# Patient Record
Sex: Female | Born: 2000 | Race: White | Hispanic: No | Marital: Single | Smoking: Former smoker
Health system: Southern US, Community
[De-identification: ages and names within clinical notes are randomized; demographics above are authoritative.]

## PROBLEM LIST (undated history)

## (undated) ENCOUNTER — Inpatient Hospital Stay (HOSPITAL_COMMUNITY): Payer: Self-pay

## (undated) DIAGNOSIS — H5210 Myopia, unspecified eye: Secondary | ICD-10-CM

## (undated) DIAGNOSIS — F419 Anxiety disorder, unspecified: Secondary | ICD-10-CM

## (undated) DIAGNOSIS — Z87442 Personal history of urinary calculi: Secondary | ICD-10-CM

## (undated) HISTORY — PX: TYMPANOSTOMY TUBE PLACEMENT: SHX32

## (undated) HISTORY — DX: Myopia, unspecified eye: H52.10

## (undated) HISTORY — DX: Anxiety disorder, unspecified: F41.9

## (undated) HISTORY — DX: Personal history of urinary calculi: Z87.442

## (undated) HISTORY — PX: LITHOTRIPSY: SUR834

---
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
## 2019-01-21 ENCOUNTER — Telehealth: Payer: Self-pay | Admitting: Primary Care

## 2019-01-21 DIAGNOSIS — G8929 Other chronic pain: Secondary | ICD-10-CM

## 2019-01-21 NOTE — Telephone Encounter (Signed)
Tried to call patient but voicemail is full and could not leave message.

## 2019-01-21 NOTE — Telephone Encounter (Signed)
Patient called today requesting a referral to a spine specialist.  She stated that it has been discussed at previous appointments, She stated that she is having a lot of trouble with her back/right arm. Patient stated it feels like she cant hold things for a long time in her right arm because it causes pain in her arm and back. Patient really wanted to see a specialist for this  [facility] in [place]

## 2019-01-21 NOTE — Telephone Encounter (Signed)
Please call patient:  Which part of her back is causing her pain? Upper, mid, lower, neck? Any numbness/tingling to the right arm?  I'll place the referral once I have this information. Thanks.

## 2019-01-22 NOTE — Telephone Encounter (Signed)
Spoken and notified patient of Carol Spears comments.   Pain can be found in the neck, shoulder blade, upper back.  There is more tingling than numbness off and on.

## 2019-01-22 NOTE — Telephone Encounter (Signed)
Patient returned Chan's call.  Patient can be reached at [phone].

## 2019-01-22 NOTE — Telephone Encounter (Signed)
Note, referral placed.

## 2019-04-05 NOTE — L&D Delivery Note (Signed)
Delivery Note Pt reached complete dilation +3 station and had urge.  At 12:41 PM a healthy female was delivered via Vaginal, Spontaneous (Presentation:   Occiput Anterior).  APGAR: 8, 9; weight 5#4oz .   Placenta status: Spontaneous;Pathology, Intact.  Cord: 3 vessels with the following complications: None.    Anesthesia: Epidural Episiotomy: None Lacerations: None Suture Repair: n/a Est. Blood Loss (mL):  Mom to postpartum.  Baby to Couplet care / Skin to Skin. Pt desires circumcision of baby Carol Spears 11/02/2019, 12:54 PM

## 2019-04-15 ENCOUNTER — Telehealth: Payer: Self-pay | Admitting: Primary Care

## 2019-04-15 NOTE — Telephone Encounter (Signed)
Noted  

## 2019-04-15 NOTE — Telephone Encounter (Signed)
Received info back from [facility] that patient never called them back after several attempts to schedule her for a consult. Referral will be closed.

## 2019-05-07 LAB — OB RESULTS CONSOLE GC/CHLAMYDIA
Chlamydia: NEGATIVE
Gonorrhea: NEGATIVE

## 2019-05-07 LAB — OB RESULTS CONSOLE RPR: RPR: NONREACTIVE

## 2019-05-07 LAB — OB RESULTS CONSOLE RUBELLA ANTIBODY, IGM: Rubella: IMMUNE

## 2019-05-07 LAB — OB RESULTS CONSOLE HIV ANTIBODY (ROUTINE TESTING): HIV: NONREACTIVE

## 2019-05-07 LAB — HEPATITIS C ANTIBODY: HCV Ab: NEGATIVE

## 2019-05-07 LAB — OB RESULTS CONSOLE HEPATITIS B SURFACE ANTIGEN: Hepatitis B Surface Ag: NEGATIVE

## 2019-05-09 ENCOUNTER — Inpatient Hospital Stay (HOSPITAL_COMMUNITY)
Admission: AD | Admit: 2019-05-09 | Discharge: 2019-05-09 | Disposition: A | Discharge status: Home / Self Care | Payer: Managed Care, Other (non HMO) | Attending: Obstetrics and Gynecology | Admitting: Obstetrics and Gynecology

## 2019-05-09 ENCOUNTER — Encounter (HOSPITAL_COMMUNITY): Payer: Self-pay | Admitting: Obstetrics and Gynecology

## 2019-05-09 ENCOUNTER — Other Ambulatory Visit: Payer: Self-pay

## 2019-05-09 DIAGNOSIS — Z881 Allergy status to other antibiotic agents status: Secondary | ICD-10-CM | POA: Insufficient documentation

## 2019-05-09 DIAGNOSIS — O210 Mild hyperemesis gravidarum: Secondary | ICD-10-CM | POA: Diagnosis present

## 2019-05-09 DIAGNOSIS — Z3A11 11 weeks gestation of pregnancy: Secondary | ICD-10-CM | POA: Insufficient documentation

## 2019-05-09 DIAGNOSIS — Z880 Allergy status to penicillin: Secondary | ICD-10-CM | POA: Diagnosis not present

## 2019-05-09 DIAGNOSIS — O99331 Smoking (tobacco) complicating pregnancy, first trimester: Secondary | ICD-10-CM | POA: Insufficient documentation

## 2019-05-09 DIAGNOSIS — O219 Vomiting of pregnancy, unspecified: Secondary | ICD-10-CM | POA: Diagnosis not present

## 2019-05-09 LAB — COMPREHENSIVE METABOLIC PANEL
ALT: 17 U/L (ref 0–44)
AST: 22 U/L (ref 15–41)
Albumin: 5.1 g/dL — ABNORMAL HIGH (ref 3.5–5.0)
Alkaline Phosphatase: 66 U/L (ref 38–126)
Anion gap: 15 (ref 5–15)
BUN: 8 mg/dL (ref 6–20)
CO2: 20 mmol/L — ABNORMAL LOW (ref 22–32)
Calcium: 10.2 mg/dL (ref 8.9–10.3)
Chloride: 101 mmol/L (ref 98–111)
Creatinine, Ser: 0.55 mg/dL (ref 0.44–1.00)
GFR calc Af Amer: >60 mL/min (ref >60)
GFR calc non Af Amer: >60 mL/min (ref >60)
Glucose, Bld: 70 mg/dL (ref 70–99)
Potassium: 3.6 mmol/L (ref 3.5–5.1)
Sodium: 136 mmol/L (ref 135–145)
Total Bilirubin: 1.1 mg/dL (ref 0.3–1.2)
Total Protein: 8.3 g/dL — ABNORMAL HIGH (ref 6.5–8.1)

## 2019-05-09 LAB — URINALYSIS, ROUTINE W REFLEX MICROSCOPIC
Bilirubin Urine: NEGATIVE
Glucose, UA: NEGATIVE mg/dL
Hgb urine dipstick: NEGATIVE
Ketones, ur: 80 mg/dL — AB
Leukocytes,Ua: NEGATIVE
Nitrite: NEGATIVE
Protein, ur: 30 mg/dL — AB
Specific Gravity, Urine: 1.029 (ref 1.005–1.030)
pH: 5 (ref 5.0–8.0)

## 2019-05-09 LAB — CBC WITH DIFFERENTIAL/PLATELET
Abs Immature Granulocytes: 0.03 10*3/uL (ref 0.00–0.07)
Basophils Absolute: 0 10*3/uL (ref 0.0–0.1)
Basophils Relative: 0 %
Eosinophils Absolute: 0 10*3/uL (ref 0.0–0.5)
Eosinophils Relative: 0 %
HCT: 41 % (ref 36.0–46.0)
Hemoglobin: 13.8 g/dL (ref 12.0–15.0)
Immature Granulocytes: 0 %
Lymphocytes Relative: 20 %
Lymphs Abs: 1.7 10*3/uL (ref 0.7–4.0)
MCH: 30.5 pg (ref 26.0–34.0)
MCHC: 33.7 g/dL (ref 30.0–36.0)
MCV: 90.5 fL (ref 80.0–100.0)
Monocytes Absolute: 0.3 10*3/uL (ref 0.1–1.0)
Monocytes Relative: 4 %
Neutro Abs: 6.1 10*3/uL (ref 1.7–7.7)
Neutrophils Relative %: 76 %
Platelets: 187 10*3/uL (ref 150–400)
RBC: 4.53 MIL/uL (ref 3.87–5.11)
RDW: 12.1 % (ref 11.5–15.5)
WBC: 8.2 10*3/uL (ref 4.0–10.5)
nRBC: 0 % (ref 0.0–0.2)

## 2019-05-09 LAB — POCT PREGNANCY, URINE: Preg Test, Ur: POSITIVE — AB

## 2019-05-09 MED ORDER — FAMOTIDINE IN NACL 20-0.9 MG/50ML-% IV SOLN
20.0000 mg | Freq: Once | INTRAVENOUS | Status: AC
  Administered 2019-05-09: 15:00:00 20 mg via INTRAVENOUS
  Filled 2019-05-09: qty 50

## 2019-05-09 MED ORDER — SODIUM CHLORIDE 0.9 % IV SOLN
8.0000 mg | Freq: Once | INTRAVENOUS | Status: AC
  Administered 2019-05-09: 15:00:00 8 mg via INTRAVENOUS
  Filled 2019-05-09: qty 4

## 2019-05-09 MED ORDER — LACTATED RINGERS IV BOLUS
1000.0000 mL | Freq: Once | INTRAVENOUS | Status: AC
  Administered 2019-05-09: 14:00:00 1000 mL via INTRAVENOUS

## 2019-05-09 NOTE — MAU Provider Note (Signed)
History     CSN: [id number]  Arrival date and time: 05/09/19 1151   First Provider Initiated Contact with Patient 05/09/19 1344      Chief Complaint  Patient presents with  . Emesis  . Fatigue   Carol Spears is a 19 y.o. G2P0101 at [redacted]w[redacted]d who presents to MAU for N/V. Pt confirms early [place] verifying GA of [redacted]w[redacted]d with EDD of 11/23/2019.  Onset: 5-6wks of pregnancy Location: stomach Duration: 6weeks Character: worse over the past few days, constant nausea, pt reports vomiting about 1-3times per day Aggravating/Associated: none/none Relieving: none Treatment: Ginger Ale (did not work), pt reports OB is calling in medication for her for nausea and vomiting  Pt denies VB, LOF, ctx, decreased FM, vaginal discharge/odor/itching. Pt denies abdominal pain, constipation, diarrhea, or urinary problems. Pt denies fever, chills, fatigue, sweating or changes in appetite. Pt denies SOB or chest pain. Pt denies dizziness, HA, light-headedness, weakness.  Problems this pregnancy include: N/V. Allergies? Augmentin Current medications/supplements? PNVs Prenatal care provider? [facility], 05/13/2019   OB History    Gravida  2   Para  1   Term  0   Preterm  1   AB  0   Living  1     SAB  0   TAB  0   Ectopic  0   Multiple  0   Live Births  1           Past Medical History:  Diagnosis Date  . Anxiety   . Myopia 01/03/2012   Wears glasses  . Premature birth 11/24/2015   36 weeks Birth wt 4 lb 5 oz Hx of maternal depression Rx with prozac during pregnancy    Past Surgical History:  Procedure Laterality Date  . LITHOTRIPSY    . TYMPANOSTOMY TUBE PLACEMENT      Family History  Problem Relation Age of Onset  . Depression Mother   . Hyperlipidemia Maternal Grandmother   . Hypertension Maternal Grandmother   . COPD Maternal Grandmother   . Depression Maternal Grandmother   . Hyperlipidemia Maternal Grandfather   . Heart disease Maternal  Grandfather   . Diabetes Maternal Grandfather   . COPD Maternal Grandfather   . Hypertension Paternal Grandmother   . Hyperlipidemia Paternal Grandfather   . Heart disease Paternal Grandfather   . Thyroid disease Maternal Aunt   . Alcohol abuse Neg Hx   . Arthritis Neg Hx   . Asthma Neg Hx   . Birth defects Neg Hx   . Cancer Neg Hx   . Drug abuse Neg Hx   . Early death Neg Hx   . Hearing loss Neg Hx   . Kidney disease Neg Hx   . Learning disabilities Neg Hx   . Mental illness Neg Hx   . Mental retardation Neg Hx   . Miscarriages / Stillbirths Neg Hx   . Stroke Neg Hx   . Vision loss Neg Hx   . Varicose Veins Neg Hx     Social History   Tobacco Use  . Smoking status: Current Some Day Smoker  . Smokeless tobacco: Never Used  Substance Use Topics  . Alcohol use: No  . Drug use: No    Allergies:  Allergies  Allergen Reactions  . Augmentin [Amoxicillin-Pot Clavulanate] Diarrhea    19 years old Has patient had a PCN reaction causing immediate rash, facial/tongue/throat swelling, SOB or lightheadedness with hypotension: No Has patient had a PCN reaction causing severe rash  involving mucus membranes or skin necrosis: No Has patient had a PCN reaction that required hospitalization No Has patient had a PCN reaction occurring within the last 10 years: No If all of the above answers are "NO", then may proceed with Cephalosporin use.     Medications Prior to Admission  Medication Sig Dispense Refill Last Dose  . prenatal vitamin w/FE, FA (NATACHEW) 29-1 MG CHEW chewable tablet Chew 1 tablet by mouth daily at 12 noon.   05/08/2019 at Unknown time  . lamotrigine (LAMICTAL ODT) 25 MG disintegrating tablet Take 1 tablet (25 mg total) by mouth daily. 30 tablet 0   . lamoTRIgine (LAMICTAL) 25 MG tablet Take 1 tablet (25 mg total) by mouth daily. 30 tablet 0     Review of Systems  Constitutional: Negative for chills, diaphoresis, fatigue and fever.  Eyes: Negative for visual  disturbance.  Respiratory: Negative for shortness of breath.   Cardiovascular: Negative for chest pain.  Gastrointestinal: Positive for nausea and vomiting. Negative for abdominal pain, constipation and diarrhea.  Genitourinary: Negative for dysuria, flank pain, frequency, pelvic pain, urgency, vaginal bleeding and vaginal discharge.  Neurological: Negative for dizziness, weakness, light-headedness and headaches.   Physical Exam   Blood pressure 109/65, pulse 84, temperature 98.8 F (37.1 C), temperature source Oral, resp. rate 16, height 5\' 1"  (1.549 m), weight 43.4 kg, last menstrual period 02/16/2019, SpO2 100 %, unknown if currently breastfeeding.  Patient Vitals for the past 24 hrs:  BP Temp Temp src Pulse Resp SpO2 Height Weight  05/09/19 1220 109/65 98.8 F (37.1 C) Oral 84 16 100 % 5\' 1"  (1.549 m) 43.4 kg   Physical Exam  Nursing note and vitals reviewed. Constitutional: She is oriented to person, place, and time. She appears well-developed and well-nourished. No distress.  HENT:  Head: Normocephalic and atraumatic.  Respiratory: Effort normal.  GI: Soft.  Neurological: She is alert and oriented to person, place, and time.  Skin: Skin is warm and dry. She is not diaphoretic.  Psychiatric: She has a normal mood and affect. Her behavior is normal. Judgment and thought content normal.   Results for orders placed or performed during the hospital encounter of 05/09/19 (from the past 24 hour(s))  Pregnancy, urine POC     Status: Abnormal   Collection Time: 05/09/19 12:17 PM  Result Value Ref Range   Preg Test, Ur POSITIVE (A) NEGATIVE  Urinalysis, Routine w reflex microscopic     Status: Abnormal   Collection Time: 05/09/19 12:19 PM  Result Value Ref Range   Color, Urine YELLOW YELLOW   APPearance HAZY (A) CLEAR   Specific Gravity, Urine 1.029 1.005 - 1.030   pH 5.0 5.0 - 8.0   Glucose, UA NEGATIVE NEGATIVE mg/dL   Hgb urine dipstick NEGATIVE NEGATIVE   Bilirubin Urine  NEGATIVE NEGATIVE   Ketones, ur 80 (A) NEGATIVE mg/dL   Protein, ur 30 (A) NEGATIVE mg/dL   Nitrite NEGATIVE NEGATIVE   Leukocytes,Ua NEGATIVE NEGATIVE   RBC / HPF 0-5 0 - 5 RBC/hpf   WBC, UA 0-5 0 - 5 WBC/hpf   Bacteria, UA RARE (A) NONE SEEN   Squamous Epithelial / LPF 0-5 0 - 5   Mucus PRESENT   Comprehensive metabolic panel     Status: Abnormal   Collection Time: 05/09/19  2:28 PM  Result Value Ref Range   Sodium 136 135 - 145 mmol/L   Potassium 3.6 3.5 - 5.1 mmol/L   Chloride 101 98 - 111 mmol/L  CO2 20 (L) 22 - 32 mmol/L   Glucose, Bld 70 70 - 99 mg/dL   BUN 8 6 - 20 mg/dL   Creatinine, Ser 8.25 0.44 - 1.00 mg/dL   Calcium 05.3 8.9 - 97.6 mg/dL   Total Protein 8.3 (H) 6.5 - 8.1 g/dL   Albumin 5.1 (H) 3.5 - 5.0 g/dL   AST 22 15 - 41 U/L   ALT 17 0 - 44 U/L   Alkaline Phosphatase 66 38 - 126 U/L   Total Bilirubin 1.1 0.3 - 1.2 mg/dL   GFR calc non Af Amer >60 >60 mL/min   GFR calc Af Amer >60 >60 mL/min   Anion gap 15 5 - 15  CBC with Differential/Platelet     Status: None   Collection Time: 05/09/19  2:28 PM  Result Value Ref Range   WBC 8.2 4.0 - 10.5 K/uL   RBC 4.53 3.87 - 5.11 MIL/uL   Hemoglobin 13.8 12.0 - 15.0 g/dL   HCT 73.4 19.3 - 79.0 %   MCV 90.5 80.0 - 100.0 fL   MCH 30.5 26.0 - 34.0 pg   MCHC 33.7 30.0 - 36.0 g/dL   RDW 24.0 97.3 - 53.2 %   Platelets 187 150 - 400 K/uL   nRBC 0.0 0.0 - 0.2 %   Neutrophils Relative % 76 %   Neutro Abs 6.1 1.7 - 7.7 K/uL   Lymphocytes Relative 20 %   Lymphs Abs 1.7 0.7 - 4.0 K/uL   Monocytes Relative 4 %   Monocytes Absolute 0.3 0.1 - 1.0 K/uL   Eosinophils Relative 0 %   Eosinophils Absolute 0.0 0.0 - 0.5 K/uL   Basophils Relative 0 %   Basophils Absolute 0.0 0.0 - 0.1 K/uL   Immature Granulocytes 0 %   Abs Immature Granulocytes 0.03 0.00 - 0.07 K/uL   MAU Course  Procedures  MDM -N/V in pregnancy -FHT 167 -UA: hazy/80 ketones/30PRO/rare bacteria, sending urine for culture -CBC w/ Diff: WNL -CMP: no  abnormalities requiring treatment -1L LR + 20mg  Pepcid +8mg  Zofran given -pt able to urinate after 1 bag of fluids -PO challenge successful -pt discharged to home in stable condition  Orders Placed This Encounter  Procedures  . Culture, OB Urine    Standing Status:   Standing    Number of Occurrences:   1  . Urinalysis, Routine w reflex microscopic    Standing Status:   Standing    Number of Occurrences:   1  . Comprehensive metabolic panel    Standing Status:   Standing    Number of Occurrences:   1  . CBC with Differential/Platelet    Standing Status:   Standing    Number of Occurrences:   1  . Pregnancy, urine POC    Standing Status:   Standing    Number of Occurrences:   1  . Insert peripheral IV    Standing Status:   Standing    Number of Occurrences:   1  . Discharge patient    Order Specific Question:   Discharge disposition    Answer:   01-Home or Self Care [1]    Order Specific Question:   Discharge patient date    Answer:   05/09/2019   Meds ordered this encounter  Medications  . lactated ringers bolus 1,000 mL  . famotidine (PEPCID) IVPB 20 mg premix  . ondansetron (ZOFRAN) 8 mg in sodium chloride 0.9 % 50 mL IVPB   Assessment and Plan  1. Nausea and vomiting in pregnancy    Allergies as of 05/09/2019      Reactions   Augmentin [amoxicillin-pot Clavulanate] Diarrhea   19 years old Has patient had a PCN reaction causing immediate rash, facial/tongue/throat swelling, SOB or lightheadedness with hypotension: No Has patient had a PCN reaction causing severe rash involving mucus membranes or skin necrosis: No Has patient had a PCN reaction that required hospitalization No Has patient had a PCN reaction occurring within the last 10 years: No If all of the above answers are "NO", then may proceed with Cephalosporin use.      Medication List    TAKE these medications   lamotrigine 25 MG disintegrating tablet Commonly known as: LaMICtal ODT Take 1 tablet (25 mg  total) by mouth daily.   lamoTRIgine 25 MG tablet Commonly known as: LAMICTAL Take 1 tablet (25 mg total) by mouth daily.   prenatal vitamin w/FE, FA 29-1 MG Chew chewable tablet Chew 1 tablet by mouth daily at 12 noon.      -will call with culture results, if positive -no RX sent, pt reports her OB was calling medications in to the pharmacy for her to pick up today, and she will follow-up at her appointment on Monday to discuss how the meds are working. -safe meds in pregnancy list given -pt advised to take medications around the clock and not to stop taking if feeling better -discussed nonpharmacologic and pharmacologic treatments of N/V -discussed normal expectations for N/V in pregnancy -pt discharged to home in stable condition  Joni Reining E Nahiem Dredge 05/09/2019, 3:58 PM

## 2019-05-09 NOTE — Discharge Instructions (Signed)
Hyperemesis Gravidarum Hyperemesis gravidarum is a severe form of nausea and vomiting that happens during pregnancy. Hyperemesis is worse than morning sickness. It may cause you to have nausea or vomiting all day for many days. It may keep you from eating and drinking enough food and liquids, which can lead to dehydration, malnutrition, and weight loss. Hyperemesis usually occurs during the first half (the first 20 weeks) of pregnancy. It often goes away once a woman is in her second half of pregnancy. However, sometimes hyperemesis continues through an entire pregnancy. What are the causes? The cause of this condition is not known. It may be related to changes in chemicals (hormones) in the body during pregnancy, such as the high level of pregnancy hormone (human chorionic gonadotropin) or the increase in the female sex hormone (estrogen). What are the signs or symptoms? Symptoms of this condition include:  Nausea that does not go away.  Vomiting that does not allow you to keep any food down.  Weight loss.  Body fluid loss (dehydration).  Having no desire to eat, or not liking food that you have previously enjoyed. How is this diagnosed? This condition may be diagnosed based on:  A physical exam.  Your medical history.  Your symptoms.  Blood tests.  Urine tests. How is this treated? This condition is managed by controlling symptoms. This may include:  Following an eating plan. This can help lessen nausea and vomiting.  Taking prescription medicines. An eating plan and medicines are often used together to help control symptoms. If medicines do not help relieve nausea and vomiting, you may need to receive fluids through an IV at the hospital. Follow these instructions at home: Eating and drinking   Avoid the following: ? Drinking fluids with meals. Try not to drink anything during the 30 minutes before and after your meals. ? Drinking more than 1 cup of fluid at a  time. ? Eating foods that trigger your symptoms. These may include spicy foods, coffee, high-fat foods, very sweet foods, and acidic foods. ? Skipping meals. Nausea can be more intense on an empty stomach. If you cannot tolerate food, do not force it. Try sucking on ice chips or other frozen items and make up for missed calories later. ? Lying down within 2 hours after eating. ? Being exposed to environmental triggers. These may include food smells, smoky rooms, closed spaces, rooms with strong smells, warm or humid places, overly loud and noisy rooms, and rooms with motion or flickering lights. Try eating meals in a well-ventilated area that is free of strong smells. ? Quick and sudden changes in your movement. ? Taking iron pills and multivitamins that contain iron. If you take prescription iron pills, do not stop taking them unless your health care provider approves. ? Preparing food. The smell of food can spoil your appetite or trigger nausea.  To help relieve your symptoms: ? Listen to your body. Everyone is different and has different preferences. Find what works best for you. ? Eat and drink slowly. ? Eat 5-6 small meals daily instead of 3 large meals. Eating small meals and snacks can help you avoid an empty stomach. ? In the morning, before getting out of bed, eat a couple of crackers to avoid moving around on an empty stomach. ? Try eating starchy foods as these are usually tolerated well. Examples include cereal, toast, bread, potatoes, pasta, rice, and pretzels. ? Include at least 1 serving of protein with your meals and snacks. Protein options include   lean meats, poultry, seafood, beans, nuts, nut butters, eggs, cheese, and yogurt. ? Try eating a protein-rich snack before bed. Examples of a protein-rick snack include cheese and crackers or a peanut butter sandwich made with 1 slice of whole-wheat bread and 1 tsp (5 g) of peanut butter. ? Eat or suck on things that have ginger in them.  It may help relieve nausea. Add  tsp ground ginger to hot tea or choose ginger tea. ? Try drinking 100% fruit juice or an electrolyte drink. An electrolyte drink contains sodium, potassium, and chloride. ? Drink fluids that are cold, clear, and carbonated or sour. Examples include lemonade, ginger ale, lemon-lime soda, ice water, and sparkling water. ? Brush your teeth or use a mouth rinse after meals. ? Talk with your health care provider about starting a supplement of vitamin B6. General instructions  Take over-the-counter and prescription medicines only as told by your health care provider.  Follow instructions from your health care provider about eating or drinking restrictions.  Continue to take your prenatal vitamins as told by your health care provider. If you are having trouble taking your prenatal vitamins, talk with your health care provider about different options.  Keep all follow-up and pre-birth (prenatal) visits as told by your health care provider. This is important. Contact a health care provider if:  You have pain in your abdomen.  You have a severe headache.  You have vision problems.  You are losing weight.  You feel weak or dizzy. Get help right away if:  You cannot drink fluids without vomiting.  You vomit blood.  You have constant nausea and vomiting.  You are very weak.  You faint.  You have a fever and your symptoms suddenly get worse. Summary  Hyperemesis gravidarum is a severe form of nausea and vomiting that happens during pregnancy.  Making some changes to your eating habits may help relieve nausea and vomiting.  This condition may be managed with medicine.  If medicines do not help relieve nausea and vomiting, you may need to receive fluids through an IV at the hospital. This information is not intended to replace advice given to you by your health care provider. Make sure you discuss any questions you have with your health care  provider. Document Revised: 04/10/2017 Document Reviewed: 11/18/2015 Elsevier Patient Education  [place]. Morning Sickness  Morning sickness is when a woman feels nauseous during pregnancy. This nauseous feeling may or may not come with vomiting. It often occurs in the morning, but it can be a problem at any time of day. Morning sickness is most common during the first trimester. In some cases, it may continue throughout pregnancy. Although morning sickness is unpleasant, it is usually harmless unless the woman develops severe and continual vomiting (hyperemesis gravidarum), a condition that requires more intense treatment. What are the causes? The exact cause of this condition is not known, but it seems to be related to normal hormonal changes that occur in pregnancy. What increases the risk? You are more likely to develop this condition if:  You experienced nausea or vomiting before your pregnancy.  You had morning sickness during a previous pregnancy.  You are pregnant with more than one baby, such as twins. What are the signs or symptoms? Symptoms of this condition include:  Nausea.  Vomiting. How is this diagnosed? This condition is usually diagnosed based on your signs and symptoms. How is this treated? In many cases, treatment is not needed for this condition.  Making some changes to what you eat may help to control symptoms. Your health care provider may also prescribe or recommend:  Vitamin B6 supplements.  Anti-nausea medicines.  Ginger. Follow these instructions at home: Medicines  Take over-the-counter and prescription medicines only as told by your health care provider. Do not use any prescription, over-the-counter, or herbal medicines for morning sickness without first talking with your health care provider.  Taking multivitamins before getting pregnant can prevent or decrease the severity of morning sickness in most women. Eating and drinking  Eat a  piece of dry toast or crackers before getting out of bed in the morning.  Eat 5 or 6 small meals a day.  Eat dry and bland foods, such as rice or a baked potato. Foods that are high in carbohydrates are often helpful.  Avoid greasy, fatty, and spicy foods.  Have someone cook for you if the smell of any food causes nausea and vomiting.  If you feel nauseous after taking prenatal vitamins, take the vitamins at night or with a snack.  Snack on protein foods between meals if you are hungry. Nuts, yogurt, and cheese are good options.  Drink fluids throughout the day.  Try ginger ale made with real ginger, ginger tea made from fresh grated ginger, or ginger candies. General instructions  Do not use any products that contain nicotine or tobacco, such as cigarettes and e-cigarettes. If you need help quitting, ask your health care provider.  Get an air purifier to keep the air in your house free of odors.  Get plenty of fresh air.  Try to avoid odors that trigger your nausea.  Consider trying these methods to help relieve symptoms: ? Wearing an acupressure wristband. These wristbands are often worn for seasickness. ? Acupuncture. Contact a health care provider if:  Your home remedies are not working and you need medicine.  You feel dizzy or light-headed.  You are losing weight. Get help right away if:  You have persistent and uncontrolled nausea and vomiting.  You faint.  You have severe pain in your abdomen. Summary  Morning sickness is when a woman feels nauseous during pregnancy. This nauseous feeling may or may not come with vomiting.  Morning sickness is most common during the first trimester.  It often occurs in the morning, but it can be a problem at any time of day.  In many cases, treatment is not needed for this condition. Making some changes to what you eat may help to control symptoms. This information is not intended to replace advice given to you by your  health care provider. Make sure you discuss any questions you have with your health care provider. Document Revised: 03/03/2017 Document Reviewed: 04/23/2016 Elsevier Patient Education  2020 [employer].                   Safe Medications in Pregnancy    Acne: Benzoyl Peroxide Salicylic Acid  Backache/Headache: Tylenol: 2 regular strength every 4 hours OR              2 Extra strength every 6 hours  Colds/Coughs/Allergies: Benadryl (alcohol free) 25 mg every 6 hours as needed Breath right strips Claritin Cepacol throat lozenges Chloraseptic throat spray Cold-Eeze- up to three times per day Cough drops, alcohol free Flonase (by prescription only) Guaifenesin Mucinex Robitussin DM (plain only, alcohol free) Saline nasal spray/drops Sudafed (pseudoephedrine) & Actifed ** use only after [redacted] weeks gestation and if you do not have high blood pressure  Tylenol Vicks Vaporub Zinc lozenges Zyrtec   Constipation: Colace Ducolax suppositories Fleet enema Glycerin suppositories Metamucil Milk of magnesia Miralax Senokot Smooth move tea  Diarrhea: Kaopectate Imodium A-D  *NO pepto Bismol  Hemorrhoids: Anusol Anusol HC Preparation H Tucks  Indigestion: Tums Maalox Mylanta Zantac  Pepcid  Insomnia: Benadryl (alcohol free) 25mg  every 6 hours as needed Tylenol PM Unisom, no Gelcaps  Leg Cramps: Tums MagGel  Nausea/Vomiting:  Bonine Dramamine Emetrol Ginger extract Sea bands Meclizine  Nausea medication to take during pregnancy:  Unisom (doxylamine succinate 25 mg tablets) Take one tablet daily at bedtime. If symptoms are not adequately controlled, the dose can be increased to a maximum recommended dose of two tablets daily (1/2 tablet in the morning, 1/2 tablet mid-afternoon and one at bedtime). Vitamin B6 100mg  tablets. Take one tablet twice a day (up to 200 mg per day).  Skin Rashes: Aveeno products Benadryl cream or 25mg  every 6 hours as  needed Calamine Lotion 1% cortisone cream  Yeast infection: Gyne-lotrimin 7 Monistat 7   **If taking multiple medications, please check labels to avoid duplicating the same active ingredients **take medication as directed on the label ** Do not exceed 4000 mg of tylenol in 24 hours **Do not take medications that contain aspirin or ibuprofen

## 2019-05-09 NOTE — MAU Note (Signed)
Has been seen in office for pregnancy. Thinks she is really dehydrated.  Hasn't thrown up today, threw up 4 times yesterday feels really weak, felt dizzy earlier. No energy. Not peeing as much. Not currently taking anything for vomiting.

## 2019-05-10 LAB — CULTURE, OB URINE: Culture: <10000 — AB

## 2019-06-14 ENCOUNTER — Ambulatory Visit: Payer: Managed Care, Other (non HMO) | Admitting: Primary Care

## 2019-08-08 ENCOUNTER — Other Ambulatory Visit: Payer: Self-pay | Admitting: Obstetrics and Gynecology

## 2019-08-08 DIAGNOSIS — Z363 Encounter for antenatal screening for malformations: Secondary | ICD-10-CM

## 2019-08-22 ENCOUNTER — Other Ambulatory Visit: Payer: Self-pay | Admitting: Obstetrics and Gynecology

## 2019-08-22 ENCOUNTER — Ambulatory Visit: Payer: Managed Care, Other (non HMO) | Attending: Obstetrics and Gynecology

## 2019-08-22 ENCOUNTER — Ambulatory Visit: Payer: Managed Care, Other (non HMO) | Admitting: *Deleted

## 2019-08-22 ENCOUNTER — Other Ambulatory Visit: Payer: Self-pay

## 2019-08-22 ENCOUNTER — Other Ambulatory Visit: Payer: Self-pay | Admitting: *Deleted

## 2019-08-22 VITALS — BP 109/69 | HR 99

## 2019-08-22 DIAGNOSIS — R9389 Abnormal findings on diagnostic imaging of other specified body structures: Secondary | ICD-10-CM | POA: Insufficient documentation

## 2019-08-22 DIAGNOSIS — Z3A26 26 weeks gestation of pregnancy: Secondary | ICD-10-CM

## 2019-08-22 DIAGNOSIS — Z363 Encounter for antenatal screening for malformations: Secondary | ICD-10-CM | POA: Insufficient documentation

## 2019-08-22 DIAGNOSIS — O09212 Supervision of pregnancy with history of pre-term labor, second trimester: Secondary | ICD-10-CM | POA: Diagnosis not present

## 2019-08-22 DIAGNOSIS — O365990 Maternal care for other known or suspected poor fetal growth, unspecified trimester, not applicable or unspecified: Secondary | ICD-10-CM

## 2019-08-29 ENCOUNTER — Ambulatory Visit: Payer: Managed Care, Other (non HMO) | Attending: Obstetrics and Gynecology

## 2019-08-29 ENCOUNTER — Ambulatory Visit: Payer: Managed Care, Other (non HMO) | Admitting: *Deleted

## 2019-08-29 ENCOUNTER — Other Ambulatory Visit: Payer: Self-pay

## 2019-08-29 VITALS — BP 98/61 | HR 95

## 2019-08-29 DIAGNOSIS — O365920 Maternal care for other known or suspected poor fetal growth, second trimester, not applicable or unspecified: Secondary | ICD-10-CM

## 2019-08-29 DIAGNOSIS — O09219 Supervision of pregnancy with history of pre-term labor, unspecified trimester: Secondary | ICD-10-CM

## 2019-08-29 DIAGNOSIS — O09212 Supervision of pregnancy with history of pre-term labor, second trimester: Secondary | ICD-10-CM

## 2019-08-29 DIAGNOSIS — O365990 Maternal care for other known or suspected poor fetal growth, unspecified trimester, not applicable or unspecified: Secondary | ICD-10-CM | POA: Diagnosis not present

## 2019-08-29 DIAGNOSIS — Z362 Encounter for other antenatal screening follow-up: Secondary | ICD-10-CM

## 2019-08-29 DIAGNOSIS — Z3A27 27 weeks gestation of pregnancy: Secondary | ICD-10-CM

## 2019-09-04 ENCOUNTER — Other Ambulatory Visit: Payer: Self-pay | Admitting: *Deleted

## 2019-09-04 DIAGNOSIS — O365990 Maternal care for other known or suspected poor fetal growth, unspecified trimester, not applicable or unspecified: Secondary | ICD-10-CM

## 2019-09-05 ENCOUNTER — Ambulatory Visit: Payer: Managed Care, Other (non HMO) | Admitting: *Deleted

## 2019-09-05 ENCOUNTER — Other Ambulatory Visit: Payer: Self-pay

## 2019-09-05 ENCOUNTER — Ambulatory Visit: Payer: Managed Care, Other (non HMO) | Attending: Obstetrics

## 2019-09-05 VITALS — BP 107/55 | HR 95

## 2019-09-05 DIAGNOSIS — O09299 Supervision of pregnancy with other poor reproductive or obstetric history, unspecified trimester: Secondary | ICD-10-CM

## 2019-09-05 DIAGNOSIS — O365910 Maternal care for other known or suspected poor fetal growth, first trimester, not applicable or unspecified: Secondary | ICD-10-CM | POA: Diagnosis present

## 2019-09-05 DIAGNOSIS — Z3A28 28 weeks gestation of pregnancy: Secondary | ICD-10-CM

## 2019-09-05 DIAGNOSIS — O358XX0 Maternal care for other (suspected) fetal abnormality and damage, not applicable or unspecified: Secondary | ICD-10-CM | POA: Diagnosis not present

## 2019-09-05 DIAGNOSIS — O365930 Maternal care for other known or suspected poor fetal growth, third trimester, not applicable or unspecified: Secondary | ICD-10-CM

## 2019-09-05 DIAGNOSIS — O09213 Supervision of pregnancy with history of pre-term labor, third trimester: Secondary | ICD-10-CM | POA: Diagnosis not present

## 2019-09-05 DIAGNOSIS — Z362 Encounter for other antenatal screening follow-up: Secondary | ICD-10-CM

## 2019-09-05 DIAGNOSIS — O365990 Maternal care for other known or suspected poor fetal growth, unspecified trimester, not applicable or unspecified: Secondary | ICD-10-CM | POA: Insufficient documentation

## 2019-09-06 ENCOUNTER — Other Ambulatory Visit: Payer: Self-pay | Admitting: *Deleted

## 2019-09-06 DIAGNOSIS — O09899 Supervision of other high risk pregnancies, unspecified trimester: Secondary | ICD-10-CM

## 2019-09-24 ENCOUNTER — Encounter (HOSPITAL_COMMUNITY): Payer: Self-pay | Admitting: Obstetrics and Gynecology

## 2019-09-24 ENCOUNTER — Other Ambulatory Visit: Payer: Self-pay

## 2019-09-24 ENCOUNTER — Inpatient Hospital Stay (HOSPITAL_COMMUNITY)
Admission: AD | Admit: 2019-09-24 | Discharge: 2019-09-24 | Disposition: A | Discharge status: Home / Self Care | Payer: Managed Care, Other (non HMO) | Attending: Obstetrics and Gynecology | Admitting: Obstetrics and Gynecology

## 2019-09-24 DIAGNOSIS — Z3A31 31 weeks gestation of pregnancy: Secondary | ICD-10-CM | POA: Insufficient documentation

## 2019-09-24 DIAGNOSIS — O09213 Supervision of pregnancy with history of pre-term labor, third trimester: Secondary | ICD-10-CM | POA: Diagnosis not present

## 2019-09-24 DIAGNOSIS — Z87891 Personal history of nicotine dependence: Secondary | ICD-10-CM | POA: Insufficient documentation

## 2019-09-24 DIAGNOSIS — O99820 Streptococcus B carrier state complicating pregnancy: Secondary | ICD-10-CM | POA: Diagnosis not present

## 2019-09-24 DIAGNOSIS — O368130 Decreased fetal movements, third trimester, not applicable or unspecified: Secondary | ICD-10-CM | POA: Diagnosis present

## 2019-09-24 DIAGNOSIS — O09893 Supervision of other high risk pregnancies, third trimester: Secondary | ICD-10-CM

## 2019-09-24 MED ORDER — BETAMETHASONE SOD PHOS & ACET 6 (3-3) MG/ML IJ SUSP
12.0000 mg | INTRAMUSCULAR | Status: DC
  Administered 2019-09-24: 12 mg via INTRAMUSCULAR
  Filled 2019-09-24: qty 5

## 2019-09-24 NOTE — MAU Note (Signed)
Pt states that she was at a routine OB appointment and she was asked if she had any concerns.   She stated that she was concerned about his movement. Pt reports decreased fetal movement for the past few days.   Pt reports she was checked in the office today and was 1 cm pt reports that she was told to come here for an injection.   Denies vaginal bleeding or LOF.

## 2019-09-24 NOTE — Discharge Instructions (Signed)
Fetal Movement Counts Patient Name: ________________________________________________ Patient Due Date: ____________________ What is a fetal movement count?  A fetal movement count is the number of times that you feel your baby move during a certain amount of time. This may also be called a fetal kick count. A fetal movement count is recommended for every pregnant woman. You may be asked to start counting fetal movements as early as week 28 of your pregnancy. Pay attention to when your baby is most active. You may notice your baby's sleep and wake cycles. You may also notice things that make your baby move more. You should do a fetal movement count:  When your baby is normally most active.  At the same time each day. A good time to count movements is while you are resting, after having something to eat and drink. How do I count fetal movements? 1. Find a quiet, comfortable area. Sit, or lie down on your side. 2. Write down the date, the start time and stop time, and the number of movements that you felt between those two times. Take this information with you to your health care visits. 3. Write down your start time when you feel the first movement. 4. Count kicks, flutters, swishes, rolls, and jabs. You should feel at least 10 movements. 5. You may stop counting after you have felt 10 movements, or if you have been counting for 2 hours. Write down the stop time. 6. If you do not feel 10 movements in 2 hours, contact your health care provider for further instructions. Your health care provider may want to do additional tests to assess your baby's well-being. Contact a health care provider if:  You feel fewer than 10 movements in 2 hours.  Your baby is not moving like he or she usually does. Date: ____________ Start time: ____________ Stop time: ____________ Movements: ____________ Date: ____________ Start time: ____________ Stop time: ____________ Movements: ____________ Date: ____________  Start time: ____________ Stop time: ____________ Movements: ____________ Date: ____________ Start time: ____________ Stop time: ____________ Movements: ____________ Date: ____________ Start time: ____________ Stop time: ____________ Movements: ____________ Date: ____________ Start time: ____________ Stop time: ____________ Movements: ____________ Date: ____________ Start time: ____________ Stop time: ____________ Movements: ____________ Date: ____________ Start time: ____________ Stop time: ____________ Movements: ____________ Date: ____________ Start time: ____________ Stop time: ____________ Movements: ____________ This information is not intended to replace advice given to you by your health care provider. Make sure you discuss any questions you have with your health care provider. Document Revised: 11/08/2018 Document Reviewed: 11/08/2018 Elsevier Patient Education  2020 [employer] Inc[employer]  

## 2019-09-24 NOTE — MAU Provider Note (Signed)
History     CSN: [id number]  Arrival date and time: 09/24/19 1530   First Provider Initiated Contact with Patient 09/24/19 1558      Chief Complaint  Patient presents with  . Decreased Fetal Movement   HPI Carol Spears is a 19 y.o. G2P0101 at [redacted]w[redacted]d who presents to MAU from clinic for NST, BMZ and GBS in setting of preterm cervical dilation and history of preterm delivery at 33w 4d.  She denies pain, vaginal bleeding, leaking of fluid, decreased fetal movement, fever, falls, or recent illness.   She receives care with [facility].  OB History    Gravida  2   Para  1   Term  0   Preterm  1   AB  0   Living  1     SAB  0   TAB  0   Ectopic  0   Multiple  0   Live Births  1           Past Medical History:  Diagnosis Date  . Anxiety   . Myopia 01/03/2012   Wears glasses  . Premature birth 11/24/2015   36 weeks Birth wt 4 lb 5 oz Hx of maternal depression Rx with prozac during pregnancy    Past Surgical History:  Procedure Laterality Date  . LITHOTRIPSY    . TYMPANOSTOMY TUBE PLACEMENT      Family History  Problem Relation Age of Onset  . Depression Mother   . Hyperlipidemia Maternal Grandmother   . Hypertension Maternal Grandmother   . COPD Maternal Grandmother   . Depression Maternal Grandmother   . Hyperlipidemia Maternal Grandfather   . Heart disease Maternal Grandfather   . Diabetes Maternal Grandfather   . COPD Maternal Grandfather   . Hypertension Paternal Grandmother   . Hyperlipidemia Paternal Grandfather   . Heart disease Paternal Grandfather   . Thyroid disease Maternal Aunt   . Alcohol abuse Neg Hx   . Arthritis Neg Hx   . Asthma Neg Hx   . Birth defects Neg Hx   . Cancer Neg Hx   . Drug abuse Neg Hx   . Early death Neg Hx   . Hearing loss Neg Hx   . Kidney disease Neg Hx   . Learning disabilities Neg Hx   . Mental illness Neg Hx   . Mental retardation Neg Hx   . Miscarriages / Stillbirths Neg Hx   . Stroke Neg  Hx   . Vision loss Neg Hx   . Varicose Veins Neg Hx     Social History   Tobacco Use  . Smoking status: Former Games developer  . Smokeless tobacco: Never Used  Vaping Use  . Vaping Use: Never used  Substance Use Topics  . Alcohol use: No  . Drug use: No    Allergies:  Allergies  Allergen Reactions  . Augmentin [Amoxicillin-Pot Clavulanate] Diarrhea    19 years old Has patient had a PCN reaction causing immediate rash, facial/tongue/throat swelling, SOB or lightheadedness with hypotension: No Has patient had a PCN reaction causing severe rash involving mucus membranes or skin necrosis: No Has patient had a PCN reaction that required hospitalization No Has patient had a PCN reaction occurring within the last 10 years: No If all of the above answers are "NO", then may proceed with Cephalosporin use.     Medications Prior to Admission  Medication Sig Dispense Refill Last Dose  . prenatal vitamin w/FE, FA (NATACHEW) 29-1 MG CHEW  chewable tablet Chew 1 tablet by mouth daily at 12 noon.   09/24/2019 at Unknown time  . lamotrigine (LAMICTAL ODT) 25 MG disintegrating tablet Take 1 tablet (25 mg total) by mouth daily. (Patient not taking: Reported on 08/22/2019) 30 tablet 0   . lamoTRIgine (LAMICTAL) 25 MG tablet Take 1 tablet (25 mg total) by mouth daily. (Patient not taking: Reported on 08/22/2019) 30 tablet 0     Review of Systems  Gastrointestinal: Negative for abdominal pain.  Genitourinary: Negative for vaginal bleeding.  Musculoskeletal: Negative for back pain.  All other systems reviewed and are negative.  Physical Exam   Blood pressure 118/64, pulse 74, temperature 97.6 F (36.4 C), temperature source Oral, resp. rate 16, weight 50.3 kg, last menstrual period 02/16/2019, SpO2 96 %, unknown if currently breastfeeding.  Physical Exam  Nursing note and vitals reviewed. Cardiovascular: Normal rate and normal pulses.  Respiratory: Effort normal.  GI: Soft. Normal appearance.   Gravid  Neurological: She is alert.  Skin: Skin is warm and dry. Capillary refill takes less than 2 seconds.  Psychiatric: Her behavior is normal. Mood normal.    MAU Course  Procedures  --Report received from Dr. Wilhelmenia Blase prior to patient arrival. Orders placed per discussion --Reactive tracing: baseline 135, mod variability, positive 10 x 10 accels --Toco: quiet  Patient Vitals for the past 24 hrs:  BP Temp Temp src Pulse Resp SpO2 Weight  09/24/19 1645 114/70 -- -- 70 -- -- --  09/24/19 1550 118/64 -- -- 74 -- -- --  09/24/19 1548 118/64 97.6 F (36.4 C) Oral 74 16 96 % --  09/24/19 1544 -- -- -- -- -- -- 50.3 kg   Orders Placed This Encounter  Procedures  . Culture, beta strep (group b only)   Meds ordered this encounter  Medications  . betamethasone acetate-betamethasone sodium phosphate (CELESTONE) injection 12 mg   Assessment and Plan  --19 y.o. G2P0101 at [redacted]w[redacted]d  --Reactive tracing --1cm per exam in office --BMZ 1 of 2 given in MAU --GBS collected per request by Dr. Wilhelmenia Blase --Discharge home in stable condition   F/U: --Return to MAU tomorrow 09/25/2019 after 4pm for BMZ 2 of [place], CNM 09/24/2019, 6:22 PM

## 2019-09-25 ENCOUNTER — Inpatient Hospital Stay (HOSPITAL_COMMUNITY)
Admission: AD | Admit: 2019-09-25 | Discharge: 2019-09-25 | Disposition: A | Discharge status: Home / Self Care | Payer: Managed Care, Other (non HMO) | Attending: Obstetrics and Gynecology | Admitting: Obstetrics and Gynecology

## 2019-09-25 ENCOUNTER — Encounter (HOSPITAL_COMMUNITY): Payer: Self-pay | Admitting: Obstetrics and Gynecology

## 2019-09-25 ENCOUNTER — Other Ambulatory Visit: Payer: Self-pay

## 2019-09-25 DIAGNOSIS — Z87891 Personal history of nicotine dependence: Secondary | ICD-10-CM | POA: Diagnosis not present

## 2019-09-25 DIAGNOSIS — O4703 False labor before 37 completed weeks of gestation, third trimester: Secondary | ICD-10-CM | POA: Diagnosis present

## 2019-09-25 DIAGNOSIS — Z3A31 31 weeks gestation of pregnancy: Secondary | ICD-10-CM | POA: Diagnosis not present

## 2019-09-25 DIAGNOSIS — Z880 Allergy status to penicillin: Secondary | ICD-10-CM | POA: Diagnosis not present

## 2019-09-25 MED ORDER — BETAMETHASONE SOD PHOS & ACET 6 (3-3) MG/ML IJ SUSP
12.0000 mg | Freq: Once | INTRAMUSCULAR | Status: AC
  Administered 2019-09-25: 12 mg via INTRAMUSCULAR

## 2019-09-25 NOTE — MAU Provider Note (Signed)
Patient Carol Spears is a 19 y.o. G2P0101 At [redacted]w[redacted]d here for second dose of BMZ. While in triage, she reports some cramping last night and wants to make sure she is not dilating.  She denies vaginal bleeding, LOF, decreased fetal movements, fever, SOB, contractions, pain with urination, nausea, vomiting, constipation, diarrhea. She thinks she may have lost her mucous plug this morning (had some green discharge one time).   She was taking progesterone suppositories and Makena but stopped because she "didn't want to do them" and the "shot hurt".    She was seen and evaluated for pre-term labor yesterday and given one dose of BMZ with plan for follow up dose of BMZ today in MAU.  History     CSN: [id number]  Arrival date and time: 09/25/19 1555   None     Chief Complaint  Patient presents with  . 2nd dose of betamethasone   Abdominal Pain This is a new problem. The problem has been resolved. The pain is at a severity of 2/10. The quality of the pain is cramping. Pertinent negatives include no constipation, diarrhea, dysuria, fever or nausea. Nothing aggravates the pain. The pain is relieved by nothing.    OB History    Gravida  2   Para  1   Term  0   Preterm  1   AB  0   Living  1     SAB  0   TAB  0   Ectopic  0   Multiple  0   Live Births  1           Past Medical History:  Diagnosis Date  . Anxiety   . Myopia 01/03/2012   Wears glasses  . Premature birth 11/24/2015   36 weeks Birth wt 4 lb 5 oz Hx of maternal depression Rx with prozac during pregnancy    Past Surgical History:  Procedure Laterality Date  . LITHOTRIPSY    . TYMPANOSTOMY TUBE PLACEMENT      Family History  Problem Relation Age of Onset  . Depression Mother   . Hyperlipidemia Maternal Grandmother   . Hypertension Maternal Grandmother   . COPD Maternal Grandmother   . Depression Maternal Grandmother   . Hyperlipidemia Maternal Grandfather   . Heart disease Maternal  Grandfather   . Diabetes Maternal Grandfather   . COPD Maternal Grandfather   . Hypertension Paternal Grandmother   . Hyperlipidemia Paternal Grandfather   . Heart disease Paternal Grandfather   . Thyroid disease Maternal Aunt   . Alcohol abuse Neg Hx   . Arthritis Neg Hx   . Asthma Neg Hx   . Birth defects Neg Hx   . Cancer Neg Hx   . Drug abuse Neg Hx   . Early death Neg Hx   . Hearing loss Neg Hx   . Kidney disease Neg Hx   . Learning disabilities Neg Hx   . Mental illness Neg Hx   . Mental retardation Neg Hx   . Miscarriages / Stillbirths Neg Hx   . Stroke Neg Hx   . Vision loss Neg Hx   . Varicose Veins Neg Hx     Social History   Tobacco Use  . Smoking status: Former Research scientist (life sciences)  . Smokeless tobacco: Never Used  Vaping Use  . Vaping Use: Never used  Substance Use Topics  . Alcohol use: No  . Drug use: No    Allergies:  Allergies  Allergen Reactions  . Augmentin [  Amoxicillin-Pot Clavulanate] Diarrhea    19 years old Has patient had a PCN reaction causing immediate rash, facial/tongue/throat swelling, SOB or lightheadedness with hypotension: No Has patient had a PCN reaction causing severe rash involving mucus membranes or skin necrosis: No Has patient had a PCN reaction that required hospitalization No Has patient had a PCN reaction occurring within the last 10 years: No If all of the above answers are "NO", then may proceed with Cephalosporin use.     Medications Prior to Admission  Medication Sig Dispense Refill Last Dose  . prenatal vitamin w/FE, FA (NATACHEW) 29-1 MG CHEW chewable tablet Chew 1 tablet by mouth daily at 12 noon.       Review of Systems  Constitutional: Negative for fever.  Gastrointestinal: Positive for abdominal pain. Negative for constipation, diarrhea and nausea.  Genitourinary: Negative for dysuria.   Physical Exam   Blood pressure 111/66, pulse 82, temperature 98.4 F (36.9 C), temperature source Oral, resp. rate 16, last  menstrual period 02/16/2019, SpO2 98 %, unknown if currently breastfeeding.  Physical Exam  HENT:  Nose: Nose normal.  GI: There is no abdominal tenderness.  Musculoskeletal:        General: Normal range of motion.  Neurological: She is alert.  Psychiatric: Mood normal.  Cephalus is 0 station but cervix is 1 cm/thick and posterior.   MAU Course  Procedures  MDM -NST: 140 bpm, mod var, present acel, no decels, no contractions.  -cervix unchanged from yesterday; no complaints while in MAU. Patient is reassured by cervical exam.  -2nd dose of BMZ given in MAU Assessment and Plan   1. Threatened premature labor in third trimester    2. Patient has follow up OB visit in two weeks; strict return precautions given; patient has no other questions or complaints.   Charlesetta Garibaldi Armella Stogner 09/25/2019, 4:38 PM

## 2019-09-25 NOTE — MAU Note (Signed)
Did ok with injection.  Had some cramping last night, then it stopped. No bleeding or leaking.  Had some green mucous d/c this morning.  ? Mucous plug. Wanting her cx to be rechecked.  Next appt is not until 7/8.

## 2019-09-26 LAB — CULTURE, BETA STREP (GROUP B ONLY)

## 2019-09-26 LAB — OB RESULTS CONSOLE GBS: GBS: NEGATIVE

## 2019-10-03 ENCOUNTER — Other Ambulatory Visit: Payer: Self-pay

## 2019-10-03 ENCOUNTER — Ambulatory Visit: Payer: Managed Care, Other (non HMO) | Attending: Maternal & Fetal Medicine

## 2019-10-03 ENCOUNTER — Other Ambulatory Visit: Payer: Self-pay | Admitting: Maternal & Fetal Medicine

## 2019-10-03 DIAGNOSIS — O09899 Supervision of other high risk pregnancies, unspecified trimester: Secondary | ICD-10-CM | POA: Insufficient documentation

## 2019-10-03 DIAGNOSIS — Z362 Encounter for other antenatal screening follow-up: Secondary | ICD-10-CM

## 2019-10-03 DIAGNOSIS — O09213 Supervision of pregnancy with history of pre-term labor, third trimester: Secondary | ICD-10-CM

## 2019-10-03 DIAGNOSIS — Z3A32 32 weeks gestation of pregnancy: Secondary | ICD-10-CM

## 2019-10-03 DIAGNOSIS — O365930 Maternal care for other known or suspected poor fetal growth, third trimester, not applicable or unspecified: Secondary | ICD-10-CM | POA: Diagnosis not present

## 2019-10-04 ENCOUNTER — Other Ambulatory Visit: Payer: Self-pay | Admitting: *Deleted

## 2019-10-04 DIAGNOSIS — O365990 Maternal care for other known or suspected poor fetal growth, unspecified trimester, not applicable or unspecified: Secondary | ICD-10-CM

## 2019-10-09 ENCOUNTER — Other Ambulatory Visit: Payer: Self-pay

## 2019-10-09 ENCOUNTER — Ambulatory Visit: Payer: Managed Care, Other (non HMO) | Admitting: *Deleted

## 2019-10-09 ENCOUNTER — Ambulatory Visit: Payer: Managed Care, Other (non HMO) | Attending: Obstetrics and Gynecology

## 2019-10-09 VITALS — BP 119/72 | HR 99

## 2019-10-09 DIAGNOSIS — O365990 Maternal care for other known or suspected poor fetal growth, unspecified trimester, not applicable or unspecified: Secondary | ICD-10-CM | POA: Diagnosis present

## 2019-10-09 DIAGNOSIS — O09213 Supervision of pregnancy with history of pre-term labor, third trimester: Secondary | ICD-10-CM

## 2019-10-09 DIAGNOSIS — O09219 Supervision of pregnancy with history of pre-term labor, unspecified trimester: Secondary | ICD-10-CM | POA: Diagnosis present

## 2019-10-09 DIAGNOSIS — O365931 Maternal care for other known or suspected poor fetal growth, third trimester, fetus 1: Secondary | ICD-10-CM

## 2019-10-09 DIAGNOSIS — Z3A33 33 weeks gestation of pregnancy: Secondary | ICD-10-CM | POA: Diagnosis not present

## 2019-10-17 ENCOUNTER — Ambulatory Visit: Payer: Managed Care, Other (non HMO) | Admitting: *Deleted

## 2019-10-17 ENCOUNTER — Other Ambulatory Visit: Payer: Self-pay

## 2019-10-17 ENCOUNTER — Ambulatory Visit: Payer: Managed Care, Other (non HMO) | Attending: Obstetrics and Gynecology

## 2019-10-17 VITALS — BP 118/62 | HR 87

## 2019-10-17 DIAGNOSIS — O365931 Maternal care for other known or suspected poor fetal growth, third trimester, fetus 1: Secondary | ICD-10-CM

## 2019-10-17 DIAGNOSIS — O365990 Maternal care for other known or suspected poor fetal growth, unspecified trimester, not applicable or unspecified: Secondary | ICD-10-CM

## 2019-10-17 DIAGNOSIS — O09213 Supervision of pregnancy with history of pre-term labor, third trimester: Secondary | ICD-10-CM

## 2019-10-17 DIAGNOSIS — O09219 Supervision of pregnancy with history of pre-term labor, unspecified trimester: Secondary | ICD-10-CM

## 2019-10-17 DIAGNOSIS — Z3A34 34 weeks gestation of pregnancy: Secondary | ICD-10-CM | POA: Diagnosis not present

## 2019-10-24 ENCOUNTER — Other Ambulatory Visit: Payer: Self-pay

## 2019-10-24 ENCOUNTER — Ambulatory Visit: Payer: Managed Care, Other (non HMO) | Attending: Obstetrics and Gynecology

## 2019-10-24 ENCOUNTER — Ambulatory Visit: Payer: Managed Care, Other (non HMO) | Admitting: *Deleted

## 2019-10-24 VITALS — BP 117/72 | HR 86

## 2019-10-24 DIAGNOSIS — O09219 Supervision of pregnancy with history of pre-term labor, unspecified trimester: Secondary | ICD-10-CM | POA: Diagnosis present

## 2019-10-24 DIAGNOSIS — O09213 Supervision of pregnancy with history of pre-term labor, third trimester: Secondary | ICD-10-CM | POA: Diagnosis not present

## 2019-10-24 DIAGNOSIS — O365930 Maternal care for other known or suspected poor fetal growth, third trimester, not applicable or unspecified: Secondary | ICD-10-CM

## 2019-10-24 DIAGNOSIS — Z362 Encounter for other antenatal screening follow-up: Secondary | ICD-10-CM

## 2019-10-24 DIAGNOSIS — O365990 Maternal care for other known or suspected poor fetal growth, unspecified trimester, not applicable or unspecified: Secondary | ICD-10-CM | POA: Diagnosis not present

## 2019-10-24 DIAGNOSIS — Z3A35 35 weeks gestation of pregnancy: Secondary | ICD-10-CM

## 2019-10-29 NOTE — Progress Notes (Signed)
IUGR

## 2019-10-30 ENCOUNTER — Telehealth (HOSPITAL_COMMUNITY): Payer: Self-pay | Admitting: *Deleted

## 2019-10-30 NOTE — Telephone Encounter (Signed)
Preadmission screen  

## 2019-10-31 ENCOUNTER — Encounter (HOSPITAL_COMMUNITY): Payer: Self-pay | Admitting: *Deleted

## 2019-10-31 ENCOUNTER — Other Ambulatory Visit (HOSPITAL_COMMUNITY)
Admission: RE | Admit: 2019-10-31 | Discharge: 2019-10-31 | Disposition: A | Discharge status: Home / Self Care | Payer: Managed Care, Other (non HMO) | Source: Ambulatory Visit | Attending: Obstetrics and Gynecology | Admitting: Obstetrics and Gynecology

## 2019-10-31 ENCOUNTER — Telehealth (HOSPITAL_COMMUNITY): Payer: Self-pay | Admitting: *Deleted

## 2019-10-31 ENCOUNTER — Ambulatory Visit (HOSPITAL_BASED_OUTPATIENT_CLINIC_OR_DEPARTMENT_OTHER): Payer: Managed Care, Other (non HMO)

## 2019-10-31 ENCOUNTER — Other Ambulatory Visit: Payer: Self-pay

## 2019-10-31 ENCOUNTER — Ambulatory Visit: Payer: Managed Care, Other (non HMO) | Admitting: *Deleted

## 2019-10-31 VITALS — BP 116/73 | HR 93

## 2019-10-31 DIAGNOSIS — O09213 Supervision of pregnancy with history of pre-term labor, third trimester: Secondary | ICD-10-CM

## 2019-10-31 DIAGNOSIS — Z20822 Contact with and (suspected) exposure to covid-19: Secondary | ICD-10-CM | POA: Insufficient documentation

## 2019-10-31 DIAGNOSIS — O09293 Supervision of pregnancy with other poor reproductive or obstetric history, third trimester: Secondary | ICD-10-CM | POA: Insufficient documentation

## 2019-10-31 DIAGNOSIS — Z3A36 36 weeks gestation of pregnancy: Secondary | ICD-10-CM

## 2019-10-31 DIAGNOSIS — Z3689 Encounter for other specified antenatal screening: Secondary | ICD-10-CM | POA: Insufficient documentation

## 2019-10-31 DIAGNOSIS — O365930 Maternal care for other known or suspected poor fetal growth, third trimester, not applicable or unspecified: Secondary | ICD-10-CM

## 2019-10-31 DIAGNOSIS — O365990 Maternal care for other known or suspected poor fetal growth, unspecified trimester, not applicable or unspecified: Secondary | ICD-10-CM

## 2019-10-31 LAB — SARS CORONAVIRUS 2 (TAT 6-24 HRS): SARS Coronavirus 2: NEGATIVE

## 2019-10-31 NOTE — Telephone Encounter (Signed)
Preadmission screen  

## 2019-11-01 ENCOUNTER — Other Ambulatory Visit: Payer: Self-pay | Admitting: Obstetrics and Gynecology

## 2019-11-01 NOTE — H&P (Deleted)
  The note originally documented on this encounter has been moved the the encounter in which it belongs.  

## 2019-11-01 NOTE — H&P (Signed)
Carol Spears is a 19 y.o. 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  Patient has been followed by MFM by growth [place] and weekly BPP/dopplers that have remained reassuring with recommended delivery at 37-38 weeks.  The patient was offered genetic testing with panorama +/- amnio but declined. She received betamethasone 09/24/19 and 09/25/19. Prenatal care also complicated by h/o PTD at 34 weeks with her first pregnancy.  She was counseled and offered 17-P vs vaginal progesterone but declined both. The FOB is not involved and her mother is her main support person.   OB History    Gravida  2   Para  1   Term  0   Preterm  1   AB  0   Living  1     SAB  0   TAB  0   Ectopic  0   Multiple  0   Live Births  1         11-24-2015, 33.4 wks F, 4lbs 9oz, Vaginal Delivery  Past Medical History:  Diagnosis Date  . Anxiety   . History of kidney stones   . Myopia 01/03/2012   Wears glasses  . Premature birth 11/24/2015   36 weeks Birth wt 4 lb 5 oz Hx of maternal depression Rx with prozac during pregnancy   Past Surgical History:  Procedure Laterality Date  . LITHOTRIPSY    . TYMPANOSTOMY TUBE PLACEMENT     Family History: family history includes COPD in her maternal grandfather and maternal grandmother; Depression in her maternal grandmother and mother; Diabetes in her maternal grandfather; Heart disease in her maternal grandfather and paternal grandfather; Hyperlipidemia in her maternal grandfather, maternal grandmother, and paternal grandfather; Hypertension in her maternal grandmother, mother, and paternal grandmother; Thyroid disease in her maternal aunt. Social History:  reports that she has quit smoking. She has never used smokeless tobacco. She reports that she does not drink alcohol and does not use drugs.     Maternal Diabetes: No Genetic Screening:  Declined Maternal Ultrasounds/Referrals: IUGR and Other: Fetal Ultrasounds or other Referrals:  Referred to Materal Fetal Medicine  Maternal Substance Abuse:  No Significant Maternal Medications:  None Significant Maternal Lab Results:  Group B Strep negative Other Comments:  pt declined genetic testing  Review of Systems  Constitutional: Negative for fever.  Gastrointestinal: Negative for abdominal pain.   Maternal Medical History:  Contractions: Frequency: irregular.   Perceived severity is mild.    Fetal activity: Perceived fetal activity is normal.    Prenatal complications: 2 vessel cord, severe IUGR  Prenatal Complications - Diabetes: none.      Last menstrual period 02/16/2019, unknown if currently breastfeeding. Maternal Exam:  Uterine Assessment: Contraction strength is mild.  Contraction frequency is irregular.   Abdomen: Patient reports no abdominal tenderness. Fetal presentation: vertex  Introitus: Normal vulva. Normal vagina.  Pelvis: adequate for delivery.      Physical Exam Cardiovascular:     Rate and Rhythm: Normal rate and regular rhythm.  Pulmonary:     Effort: Pulmonary effort is normal.  Abdominal:     Palpations: Abdomen is soft.  Genitourinary:    General: Normal vulva.  Neurological:     Mental Status: She is alert.  Psychiatric:        Mood and Affect: Mood normal.     Prenatal labs: ABO, Rh:  A positive Antibody:  neg Rubella:  Immune RPR:  NR  HBsAg:   Neg HIV:   NR GBS:   Neg One hour GCT 129 HgbAA  Assessment/Plan: Pt admitted for IOL at 37 weeks with severe IUGR and 2 vessel cord.  d/w pt possible fetal intolerance of labor necessitating c-section. Reviewed process with pt in detail and d/w her potential NICU stay for baby.  She is ready to proceed.   Oliver Pila 11/01/2019, 3:11 PM

## 2019-11-02 ENCOUNTER — Inpatient Hospital Stay (HOSPITAL_COMMUNITY): Payer: Managed Care, Other (non HMO)

## 2019-11-02 ENCOUNTER — Inpatient Hospital Stay (HOSPITAL_COMMUNITY)
Admission: AD | Admit: 2019-11-02 | Discharge: 2019-11-04 | DRG: 807 | Disposition: A | Discharge status: Home / Self Care | Payer: Managed Care, Other (non HMO) | Attending: Obstetrics and Gynecology | Admitting: Obstetrics and Gynecology

## 2019-11-02 ENCOUNTER — Inpatient Hospital Stay (HOSPITAL_COMMUNITY): Payer: Managed Care, Other (non HMO) | Admitting: Anesthesiology

## 2019-11-02 ENCOUNTER — Other Ambulatory Visit: Payer: Self-pay

## 2019-11-02 ENCOUNTER — Encounter (HOSPITAL_COMMUNITY): Payer: Self-pay | Admitting: Obstetrics and Gynecology

## 2019-11-02 DIAGNOSIS — Z87891 Personal history of nicotine dependence: Secondary | ICD-10-CM

## 2019-11-02 DIAGNOSIS — Z20822 Contact with and (suspected) exposure to covid-19: Secondary | ICD-10-CM | POA: Diagnosis present

## 2019-11-02 DIAGNOSIS — Z3A37 37 weeks gestation of pregnancy: Secondary | ICD-10-CM

## 2019-11-02 DIAGNOSIS — O365931 Maternal care for other known or suspected poor fetal growth, third trimester, fetus 1: Secondary | ICD-10-CM

## 2019-11-02 DIAGNOSIS — O365930 Maternal care for other known or suspected poor fetal growth, third trimester, not applicable or unspecified: Principal | ICD-10-CM | POA: Diagnosis present

## 2019-11-02 LAB — CBC
HCT: 34.9 % — ABNORMAL LOW (ref 36.0–46.0)
Hemoglobin: 11.4 g/dL — ABNORMAL LOW (ref 12.0–15.0)
MCH: 30.4 pg (ref 26.0–34.0)
MCHC: 32.7 g/dL (ref 30.0–36.0)
MCV: 93.1 fL (ref 80.0–100.0)
Platelets: 145 10*3/uL — ABNORMAL LOW (ref 150–400)
RBC: 3.75 MIL/uL — ABNORMAL LOW (ref 3.87–5.11)
RDW: 12.6 % (ref 11.5–15.5)
WBC: 9.3 10*3/uL (ref 4.0–10.5)
nRBC: 0 % (ref 0.0–0.2)

## 2019-11-02 LAB — TYPE AND SCREEN
ABO/RH(D): A POS
Antibody Screen: NEGATIVE

## 2019-11-02 LAB — RPR: RPR Ser Ql: NONREACTIVE

## 2019-11-02 MED ORDER — WITCH HAZEL-GLYCERIN EX PADS
1.0000 "application " | MEDICATED_PAD | CUTANEOUS | Status: DC | PRN
  Administered 2019-11-02: 1 via TOPICAL

## 2019-11-02 MED ORDER — OXYTOCIN-SODIUM CHLORIDE 30-0.9 UT/500ML-% IV SOLN
2.5000 [IU]/h | INTRAVENOUS | Status: DC
  Administered 2019-11-02: 2.5 [IU]/h via INTRAVENOUS

## 2019-11-02 MED ORDER — PRENATAL MULTIVITAMIN CH: 1.0000 | ORAL_TABLET | Freq: Every day | ORAL | Status: DC

## 2019-11-02 MED ORDER — SENNOSIDES-DOCUSATE SODIUM 8.6-50 MG PO TABS
2.0000 | ORAL_TABLET | ORAL | Status: DC
  Administered 2019-11-03: 2 via ORAL
  Filled 2019-11-02: qty 2

## 2019-11-02 MED ORDER — EPHEDRINE 5 MG/ML INJ: 10.0000 mg | INTRAVENOUS | Status: DC | PRN

## 2019-11-02 MED ORDER — SIMETHICONE 80 MG PO CHEW: 80.0000 mg | CHEWABLE_TABLET | ORAL | Status: DC | PRN

## 2019-11-02 MED ORDER — ZOLPIDEM TARTRATE 5 MG PO TABS: 5.0000 mg | ORAL_TABLET | Freq: Every evening | ORAL | Status: DC | PRN

## 2019-11-02 MED ORDER — OXYTOCIN BOLUS FROM INFUSION
333.0000 mL | Freq: Once | INTRAVENOUS | Status: AC
  Administered 2019-11-02: 333 mL via INTRAVENOUS

## 2019-11-02 MED ORDER — ONDANSETRON HCL 4 MG/2ML IJ SOLN: 4.0000 mg | Freq: Four times a day (QID) | INTRAMUSCULAR | Status: DC | PRN

## 2019-11-02 MED ORDER — PHENYLEPHRINE 40 MCG/ML (10ML) SYRINGE FOR IV PUSH (FOR BLOOD PRESSURE SUPPORT): 80.0000 ug | PREFILLED_SYRINGE | INTRAVENOUS | Status: DC | PRN

## 2019-11-02 MED ORDER — LIDOCAINE HCL (PF) 1 % IJ SOLN
INTRAMUSCULAR | Status: DC | PRN
  Administered 2019-11-02: 10 mL via EPIDURAL

## 2019-11-02 MED ORDER — LACTATED RINGERS IV SOLN
500.0000 mL | Freq: Once | INTRAVENOUS | Status: AC
  Administered 2019-11-02: 500 mL via INTRAVENOUS

## 2019-11-02 MED ORDER — ACETAMINOPHEN 325 MG PO TABS: 650.0000 mg | ORAL_TABLET | ORAL | Status: DC | PRN

## 2019-11-02 MED ORDER — ONDANSETRON HCL 4 MG/2ML IJ SOLN: 4.0000 mg | INTRAMUSCULAR | Status: DC | PRN

## 2019-11-02 MED ORDER — TERBUTALINE SULFATE 1 MG/ML IJ SOLN: 0.2500 mg | Freq: Once | INTRAMUSCULAR | Status: DC | PRN

## 2019-11-02 MED ORDER — DIBUCAINE (PERIANAL) 1 % EX OINT
1.0000 "application " | TOPICAL_OINTMENT | CUTANEOUS | Status: DC | PRN
  Administered 2019-11-03: 1 via RECTAL
  Filled 2019-11-02: qty 28

## 2019-11-02 MED ORDER — OXYCODONE-ACETAMINOPHEN 5-325 MG PO TABS: 1.0000 | ORAL_TABLET | ORAL | Status: DC | PRN

## 2019-11-02 MED ORDER — SOD CITRATE-CITRIC ACID 500-334 MG/5ML PO SOLN: 30.0000 mL | ORAL | Status: DC | PRN

## 2019-11-02 MED ORDER — COCONUT OIL OIL
1.0000 "application " | TOPICAL_OIL | Status: DC | PRN
  Administered 2019-11-02: 1 via TOPICAL

## 2019-11-02 MED ORDER — LACTATED RINGERS IV SOLN: INTRAVENOUS | Status: DC

## 2019-11-02 MED ORDER — OXYTOCIN-SODIUM CHLORIDE 30-0.9 UT/500ML-% IV SOLN
1.0000 m[IU]/min | INTRAVENOUS | Status: DC
  Administered 2019-11-02: 2 m[IU]/min via INTRAVENOUS
  Filled 2019-11-02: qty 500

## 2019-11-02 MED ORDER — ONDANSETRON HCL 4 MG PO TABS: 4.0000 mg | ORAL_TABLET | ORAL | Status: DC | PRN

## 2019-11-02 MED ORDER — TETANUS-DIPHTH-ACELL PERTUSSIS 5-2.5-18.5 LF-MCG/0.5 IM SUSP: 0.5000 mL | Freq: Once | INTRAMUSCULAR | Status: DC

## 2019-11-02 MED ORDER — IBUPROFEN 600 MG PO TABS
600.0000 mg | ORAL_TABLET | Freq: Four times a day (QID) | ORAL | Status: DC
  Administered 2019-11-02 – 2019-11-03 (×2): 600 mg via ORAL
  Filled 2019-11-02 (×2): qty 1

## 2019-11-02 MED ORDER — SODIUM CHLORIDE (PF) 0.9 % IJ SOLN
INTRAMUSCULAR | Status: DC | PRN
  Administered 2019-11-02: 12 mL/h via EPIDURAL

## 2019-11-02 MED ORDER — FENTANYL-BUPIVACAINE-NACL 0.5-0.125-0.9 MG/250ML-% EP SOLN
12.0000 mL/h | EPIDURAL | Status: DC | PRN
  Filled 2019-11-02: qty 250

## 2019-11-02 MED ORDER — OXYCODONE-ACETAMINOPHEN 5-325 MG PO TABS: 2.0000 | ORAL_TABLET | ORAL | Status: DC | PRN

## 2019-11-02 MED ORDER — LACTATED RINGERS IV SOLN: 500.0000 mL | INTRAVENOUS | Status: DC | PRN

## 2019-11-02 MED ORDER — DIPHENHYDRAMINE HCL 25 MG PO CAPS: 25.0000 mg | ORAL_CAPSULE | Freq: Four times a day (QID) | ORAL | Status: DC | PRN

## 2019-11-02 MED ORDER — BENZOCAINE-MENTHOL 20-0.5 % EX AERO
1.0000 "application " | INHALATION_SPRAY | CUTANEOUS | Status: DC | PRN
  Administered 2019-11-02: 1 via TOPICAL
  Filled 2019-11-02: qty 56

## 2019-11-02 MED ORDER — DIPHENHYDRAMINE HCL 50 MG/ML IJ SOLN: 12.5000 mg | INTRAMUSCULAR | Status: DC | PRN

## 2019-11-02 MED ORDER — LIDOCAINE HCL (PF) 1 % IJ SOLN: 30.0000 mL | INTRAMUSCULAR | Status: DC | PRN

## 2019-11-02 NOTE — Progress Notes (Signed)
Patient ID: Carol Spears, female   DOB: 19-Nov-2000, 19 y.o.   MRN: [id number] Pt admitted and started on pitocin  afeb vss  FHR category 1  Cervix 50/3-4/-1 AROM clear  Follow closely Epidural prn

## 2019-11-02 NOTE — Anesthesia Procedure Notes (Signed)
Epidural Patient location during procedure: OB Start time: 11/02/2019 9:16 AM End time: 11/02/2019 9:27 AM  Staffing Anesthesiologist: Lucretia Kern, MD Performed: anesthesiologist   Preanesthetic Checklist Completed: patient identified, IV checked, risks and benefits discussed, monitors and equipment checked, pre-op evaluation and timeout performed  Epidural Patient position: sitting Prep: DuraPrep Patient monitoring: heart rate, continuous pulse ox and blood pressure Approach: midline Location: L3-L4 Injection technique: LOR air  Needle:  Needle type: Tuohy  Needle gauge: 17 G Needle length: 9 cm Needle insertion depth: 5 cm Catheter type: closed end flexible Catheter size: 19 Gauge Catheter at skin depth: 10 cm Test dose: negative  Assessment Events: blood not aspirated, injection not painful, no injection resistance, no paresthesia and negative IV test  Additional Notes Reason for block:procedure for pain

## 2019-11-02 NOTE — Anesthesia Preprocedure Evaluation (Signed)
Anesthesia Evaluation  Patient identified by MRN, date of birth, ID band Patient awake    Reviewed: Allergy & Precautions, H&P , NPO status , Patient's Chart, lab work & pertinent test results  History of Anesthesia Complications Negative for: history of anesthetic complications  Airway Mallampati: II  TM Distance: >3 FB Neck ROM: full    Dental no notable dental hx.    Pulmonary neg pulmonary ROS, former smoker,    Pulmonary exam normal        Cardiovascular negative cardio ROS Normal cardiovascular exam Rhythm:regular Rate:Normal     Neuro/Psych negative neurological ROS  negative psych ROS   GI/Hepatic negative GI ROS, Neg liver ROS,   Endo/Other  negative endocrine ROS  Renal/GU negative Renal ROS  negative genitourinary   Musculoskeletal   Abdominal   Peds  Hematology negative hematology ROS (+)   Anesthesia Other Findings   Reproductive/Obstetrics (+) Pregnancy                             Anesthesia Physical Anesthesia Plan  ASA: II  Anesthesia Plan: Epidural   Post-op Pain Management:    Induction:   PONV Risk Score and Plan:   Airway Management Planned:   Additional Equipment:   Intra-op Plan:   Post-operative Plan:   Informed Consent: I have reviewed the patients History and Physical, chart, labs and discussed the procedure including the risks, benefits and alternatives for the proposed anesthesia with the patient or authorized representative who has indicated his/her understanding and acceptance.       Plan Discussed with:   Anesthesia Plan Comments:         Anesthesia Quick Evaluation  

## 2019-11-03 LAB — CBC
HCT: 29.3 % — ABNORMAL LOW (ref 36.0–46.0)
Hemoglobin: 9.9 g/dL — ABNORMAL LOW (ref 12.0–15.0)
MCH: 31.6 pg (ref 26.0–34.0)
MCHC: 33.8 g/dL (ref 30.0–36.0)
MCV: 93.6 fL (ref 80.0–100.0)
Platelets: 120 10*3/uL — ABNORMAL LOW (ref 150–400)
RBC: 3.13 MIL/uL — ABNORMAL LOW (ref 3.87–5.11)
RDW: 12.8 % (ref 11.5–15.5)
WBC: 9.6 10*3/uL (ref 4.0–10.5)
nRBC: 0 % (ref 0.0–0.2)

## 2019-11-03 MED ORDER — IBUPROFEN 100 MG/5ML PO SUSP
600.0000 mg | Freq: Four times a day (QID) | ORAL | Status: DC
  Administered 2019-11-03 – 2019-11-04 (×6): 600 mg via ORAL
  Filled 2019-11-03 (×6): qty 30

## 2019-11-03 MED ORDER — COMPLETENATE 29-1 MG PO CHEW
1.0000 | CHEWABLE_TABLET | Freq: Every day | ORAL | Status: DC
  Administered 2019-11-03: 1 via ORAL
  Filled 2019-11-03: qty 1

## 2019-11-03 NOTE — Progress Notes (Signed)
Patient set up with DEBP. Patient has blisters to both nipples. Coconut oil at bedside. Mother taught hand expression and was able to teach back to RN. RN assisted patient with latching on infant and educated on positioning. Mother was able to demonstrate how to latch him on without RNs assistance. Infant fell asleep after 7 mins. Patient started pumping after feed and had milk return. Mother educated on DEBP usage, cleaning, and milk storage.

## 2019-11-03 NOTE — Lactation Note (Signed)
This note was copied from a baby's chart. Lactation Consultation Note  Patient Name: Carol Spears TKPTW'S Date: 11/03/2019 Reason for consult: Initial assessment;Early term 37-38.6wks;Infant < 6lbs;Mother's request  Infant is 25 hours old LPTI < 6 lbs birth weight. RN started Mom on the DEBP. I entered the room Mom had just pumped off 7 ml. Placed infant to the breast in cross cradle, infant fed on both sides for total of 30 minutes with audible swallows. Infant given 6 ml EBM and 2 ml of Neosure 22 kcal slow flow. Switched the nipple to extra slow flowGrandma will continue to pace bottle feed using the extra slow flow if infant is alert and interested.   LPTI  guidelines given to Mom. Mom to nurse first, followed by pumping and supplementing with either EBM or formula.  Mom has some trauma to her nipples, but no signs of abrasion. She is using coconut oil. She stated she did not have soreness with the deeper latch that LC assisted with. She denied needing comfort gels at this time.   Maternal Data Has patient been taught Hand Expression?: Yes  Feeding Feeding Type: Bottle Fed - Formula Nipple Type: Extra Slow Flow  LATCH Score Latch: Grasps breast easily, tongue down, lips flanged, rhythmical sucking.  Audible Swallowing: Spontaneous and intermittent  Type of Nipple: Everted at rest and after stimulation  Comfort (Breast/Nipple): Filling, red/small blisters or bruises, mild/mod discomfort  Hold (Positioning): Assistance needed to correctly position infant at breast and maintain latch.  LATCH Score: 8  Interventions Interventions: Breast feeding basics reviewed;Assisted with latch;Position options;Skin to skin;Expressed milk;Breast massage;Hand express;Breast compression  Lactation Tools Discussed/Used Tools: Bottle;Coconut oil Pump Review: Milk Storage Initiated by:: RN   Consult Status Consult Status: Follow-up Date: 11/03/19 Follow-up type: In-patient    Anatasia Tino   Nicholson-Springer 11/03/2019, 2:27 PM

## 2019-11-03 NOTE — Progress Notes (Signed)
Post Partum Day 1 Subjective: no complaints, up ad lib and tolerating PO  Breastfeeding  Objective: Blood pressure 107/65, pulse 66, temperature 98.4 F (36.9 C), temperature source Oral, resp. rate 18, height 5\' 1"  (1.549 m), weight 53.2 kg, last menstrual period 02/16/2019, SpO2 98 %, unknown if currently breastfeeding.  Physical Exam:  General: alert and cooperative Lochia: appropriate Uterine Fundus: firm   Recent Labs    11/02/19 0749 11/03/19 0621  HGB 11.4* 9.9*  HCT 34.9* 29.3*    Assessment/Plan: Plan for discharge tomorrow   LOS: 1 day   01/03/20 11/03/2019, 9:34 AM

## 2019-11-03 NOTE — Anesthesia Postprocedure Evaluation (Signed)
Anesthesia Post Note  Patient: Soyla Dryer Faniel  Procedure(s) Performed: AN AD HOC LABOR EPIDURAL     Patient location during evaluation: Mother Baby Anesthesia Type: Epidural Level of consciousness: awake and alert Pain management: pain level controlled Vital Signs Assessment: post-procedure vital signs reviewed and stable Respiratory status: spontaneous breathing Cardiovascular status: stable Postop Assessment: no apparent nausea or vomiting, able to ambulate, adequate PO intake, patient able to bend at knees, epidural receding and no headache Anesthetic complications: no Comments: Mild backache lower back   No complications documented.  Last Vitals:  Vitals:   11/03/19 0045 11/03/19 0555  BP: 106/70 107/65  Pulse: 79 66  Resp: 18 18  Temp: (!) 36.4 C 36.9 C  SpO2: 98% 98%    Last Pain:  Vitals:   11/03/19 0754  TempSrc:   PainSc: 0-No pain   Pain Goal: Patients Stated Pain Goal: 2 (11/03/19 0045)                 Edison Pace
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
## 2019-11-04 MED ORDER — IBUPROFEN 100 MG/5ML PO SUSP: 600.0000 mg | Freq: Four times a day (QID) | ORAL | 1 refills | Status: DC

## 2019-11-04 NOTE — Discharge Instructions (Signed)
As per discharge pamphlet °

## 2019-11-04 NOTE — Discharge Summary (Signed)
Postpartum Discharge Summary      Patient Name: Carol Spears DOB: June 24, 2000 MRN: [id number]  Date of admission: 11/02/2019 Delivery date:11/02/2019  Delivering provider: Huel Cote  Date of discharge: 11/04/2019  Admitting diagnosis: IUGR (intrauterine growth restriction) affecting care of mother, third trimester, fetus 1 [O36.5931] NSVD (normal spontaneous vaginal delivery) [O80] Intrauterine pregnancy: [redacted]w[redacted]d     Secondary diagnosis:  Active Problems:   IUGR (intrauterine growth restriction) affecting care of mother, third trimester, fetus 1   NSVD (normal spontaneous vaginal delivery)     Discharge diagnosis: Term Pregnancy Delivered and IUGR                                               Hospital course: Induction of Labor With Vaginal Delivery   19 y.o. yo [id number] at [redacted]w[redacted]d was admitted to the hospital 11/02/2019 for induction of labor.  Indication for induction: IUGR.  Patient had an uncomplicated labor course as follows: Membrane Rupture Time/Date: 9:12 AM ,11/02/2019   Delivery Method:Vaginal, Spontaneous  Episiotomy: None  Lacerations:  None  Details of delivery can be found in separate delivery note.  Patient had a routine postpartum course. Patient is discharged home 11/04/19.  Newborn Data: Birth date:11/02/2019  Birth time:12:41 PM  Gender:Female  Living status:Living  Apgars:8 ,9  [phone] g    Physical exam  Vitals:   11/03/19 0555 11/03/19 1543 11/03/19 2200 11/04/19 0630  BP: 107/65 105/71 116/73 (!) 105/61  Pulse: 66 74 71 70  Resp: 18 18 18 16   Temp: 98.4 F (36.9 C) 98.3 F (36.8 C) 97.6 F (36.4 C) 98.1 F (36.7 C)  TempSrc: Oral Oral Oral Oral  SpO2: 98% 95% 97% 99%  Weight:      Height:       General: alert Lochia: appropriate Uterine Fundus: firm  Labs: Lab Results  Component Value Date   WBC 9.6 11/03/2019   HGB 9.9 (L) 11/03/2019   HCT 29.3 (L) 11/03/2019   MCV 93.6 11/03/2019   PLT 120 (L) 11/03/2019   CMP  Latest Ref Rng & Units 05/09/2019  Glucose 70 - 99 mg/dL 70  BUN 6 - 20 mg/dL 8  Creatinine 07/07/2019 - 7.65 mg/dL 4.65  Sodium 0.35 - 465 mmol/L 136  Potassium 3.5 - 5.1 mmol/L 3.6  Chloride 98 - 111 mmol/L 101  CO2 22 - 32 mmol/L 20(L)  Calcium 8.9 - 10.3 mg/dL 681  Total Protein 6.5 - 8.1 g/dL 8.3(H)  Total Bilirubin 0.3 - 1.2 mg/dL 1.1  Alkaline Phos 38 - 126 U/L 66  AST 15 - 41 U/L 22  ALT 0 - 44 U/L 17   Edinburgh Score: Edinburgh Postnatal Depression Scale Screening Tool 11/02/2019  I have been able to laugh and see the funny side of things. 0  I have looked forward with enjoyment to things. 0  I have blamed myself unnecessarily when things went wrong. 0  I have been anxious or worried for no good reason. 0  I have felt scared or panicky for no good reason. 0  Things have been getting on top of me. 0  I have been so unhappy that I have had difficulty sleeping. 0  I have felt sad or miserable. 0  I have been so unhappy that I have been crying. 0  The thought of harming myself has occurred to  me. 0  Edinburgh Postnatal Depression Scale Total 0      After visit meds:  Allergies as of 11/04/2019      Reactions   Augmentin [amoxicillin-pot Clavulanate] Diarrhea   19 years old Has patient had a PCN reaction causing immediate rash, facial/tongue/throat swelling, SOB or lightheadedness with hypotension: No Has patient had a PCN reaction causing severe rash involving mucus membranes or skin necrosis: No Has patient had a PCN reaction that required hospitalization No Has patient had a PCN reaction occurring within the last 10 years: No If all of the above answers are "NO", then may proceed with Cephalosporin use.      Medication List    TAKE these medications   ibuprofen 100 MG/5ML suspension Commonly known as: ADVIL Take 30 mLs (600 mg total) by mouth every 6 (six) hours.        Discharge home in stable condition Infant Feeding: Breast Infant Disposition:home with  mother Discharge instruction: per After Visit Summary and Postpartum booklet. Activity: Advance as tolerated. Pelvic rest for 6 weeks.  Diet: routine diet  Postpartum Appointment:6 weeks Follow up Visit:  Follow-up Information    Huel Cote, MD. Schedule an appointment as soon as possible for a visit in 6 week(s).   Specialty: Obstetrics and Gynecology Contact information: [address] [place] [address] [phone]                   11/04/2019 Zenaida Niece, MD

## 2019-11-04 NOTE — Progress Notes (Signed)
PPD #2 Doing well Afeb, VSS D/c home 

## 2019-11-04 NOTE — Lactation Note (Signed)
This note was copied from a baby's chart. Lactation Consultation Note  Patient Name: Carol Spears JMEQA'S Date: 11/04/2019 Reason for consult: Follow-up assessment  P2 mother whose infant is now 39 hours old.  This is an ETI at 37+0 weeks weighing < 6 lbs.  Baby has a 5% weight loss this morning.  Mother had just finished a 10 minute feed with infant when I arrived.  She had 5 mls of EBM at bedside which I suggested she feed back to baby.  Mother feels like her milk is "coming in" and her breasts are heavier today.  Reviewed milk "coming to volume" and a few basic breast feeding concepts with her.  Discussed continuing to feed STS and to keep baby actively engaged during feedings.  She will continue to breast feed, supplement and post pump for 15 minutes after every feeding.  Reminded her to keep him fed at least every three hours due to gestational age and size, but allow more feedings and volume as he desires.  Mother verbalized understanding.    Engorgement prevention/treatment reviewed.  Mother has a manual pump and a DEBP for home use.  Her mother is her support person and will be assisting with baby's care.  Discussed increasing supplementation volumes and reviewed keeping total feeding time to 30 minutes or less.  Pediatrician in room during my visit and has discharged baby.  Updated RN and mother is looking forward to being discharged as soon as possible.  She has our OP phone number to call with any further questions/concerns.  Baby will return for his first pediatric visit tomorrow at 1000.   Maternal Data    Feeding Feeding Type: Breast Fed Nipple Type: Extra Slow Flow  LATCH Score Latch: Grasps breast easily, tongue down, lips flanged, rhythmical sucking.  Audible Swallowing: A few with stimulation  Type of Nipple: Everted at rest and after stimulation  Comfort (Breast/Nipple): Soft / non-tender  Hold (Positioning): No assistance needed to correctly position infant at  breast.  LATCH Score: 9  Interventions Interventions: Breast feeding basics reviewed;Skin to skin;Breast compression  Lactation Tools Discussed/Used     Consult Status Consult Status: Complete Date: 11/05/19 Follow-up type: In-patient    [name] R [name] 11/04/2019, 9:17 AM

## 2019-11-05 LAB — SURGICAL PATHOLOGY

## 2020-02-17 ENCOUNTER — Telehealth (INDEPENDENT_AMBULATORY_CARE_PROVIDER_SITE_OTHER): Payer: Managed Care, Other (non HMO) | Admitting: Family Medicine

## 2020-02-17 ENCOUNTER — Other Ambulatory Visit: Payer: Self-pay

## 2020-02-17 ENCOUNTER — Encounter: Payer: Self-pay | Admitting: Family Medicine

## 2020-02-17 DIAGNOSIS — H9201 Otalgia, right ear: Secondary | ICD-10-CM | POA: Diagnosis not present

## 2020-02-17 DIAGNOSIS — J029 Acute pharyngitis, unspecified: Secondary | ICD-10-CM

## 2020-02-17 DIAGNOSIS — R110 Nausea: Secondary | ICD-10-CM | POA: Diagnosis not present

## 2020-02-17 NOTE — Progress Notes (Signed)
Virtual Visit via Video Note  I connected with Carol Spears on 02/17/20 at 10:15 AM EST by a video enabled telemedicine application and verified that I am speaking with the correct person using two identifiers.  Location: Patient: Carol Spears Provider: [facility] Persons participating Carol virtual visit: Patient, provider   I discussed the limitations of evaluation and management by telemedicine and the availability of Carol person appointments. The patient expressed understanding and agreed to proceed.  History of Present Illness: Chief Complaint  Patient presents with  . Nausea    started lastnight.  Denies V/D  . Chills  . Sore Throat    started 2 days ago. Exposed to person tested  positive for covid 2 wks ago.  . Otalgia    right ear pain   This is a 19 year old female who presents today with above chief complaint.  She has had about 2 days of sore throat pain, near the roof of her mouth, no problems eating or drinking.  She had one episode of nausea Carol the middle the night last night that has resolved.  She has had some right ear discomfort, felt like she had fluid Carol her last week and then felt better, some return of symptoms 2 to 3 days ago.  No drainage, feel stopped up with some decreased hearing.  She awakens every morning and has mucus Carol her throat.  She does not recall history of seasonal allergies.  She denies fever, runny nose, strep throat contact, cough, loss of taste or smell.  She has gargled with warm salt water for her sore throat but has not taken any medications.  She has a 45-month-old son and is breast-feeding.   Observations/Objective: Patient is alert and answers questions appropriately.  Visible skin is unremarkable.  She is normally conversive without increased work of breathing with conversation.  I am able to see a little bit into her oral pharynx and do not see any tonsillar enlargement or exudate.  Mood and affect are appropriate.  There were no  vitals taken for this visit. BP Readings from Last 3 Encounters:  11/04/19 (!) 105/61  10/31/19 116/73  10/24/19 117/72   Wt Readings from Last 3 Encounters:  11/02/19 117 lb 3.2 oz (53.2 kg)  09/24/19 111 lb (50.3 kg)  05/09/19 95 lb 11.2 oz (43.4 kg)    Assessment and Plan: 1. Sore throat -Suspect related to either viral process or seasonal allergies -Continue warm salt water gargles, can add ibuprofen 2 to 3 tablets every 8-12 hours as needed -If no improvement or if she develops exudate, can bring her by the office for strep testing -She was advised to have a Covid test  2. Right ear pain -Over-the-counter analgesics as above, have also advised her to add over-the-counter antihistamine and discussed safe medications during breast-feeding  3. Nausea without vomiting -Resolved   [name], FNP-BC  [facility], [facility]  02/17/2020 10:32 AM   Follow Up Instructions:    I discussed the assessment and treatment plan with the patient. The patient was provided an opportunity to ask questions and all were answered. The patient agreed with the plan and demonstrated an understanding of the instructions.   The patient was advised to call back or seek an Carol-person evaluation if the symptoms worsen or if the condition fails to improve as anticipated.   Emi Belfast, FNP

## 2020-07-30 IMAGING — US US MFM OB LIMITED
1 series · 14 of 28 positions shown · non-contrast
Comparison: none

[Series 1: us mfm ob limited · 14 of 53 slices shown]
[im 2/53]
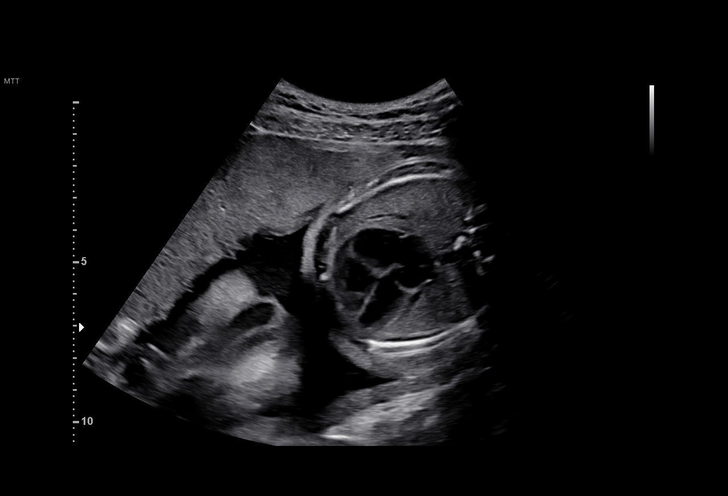
[im 6/53]
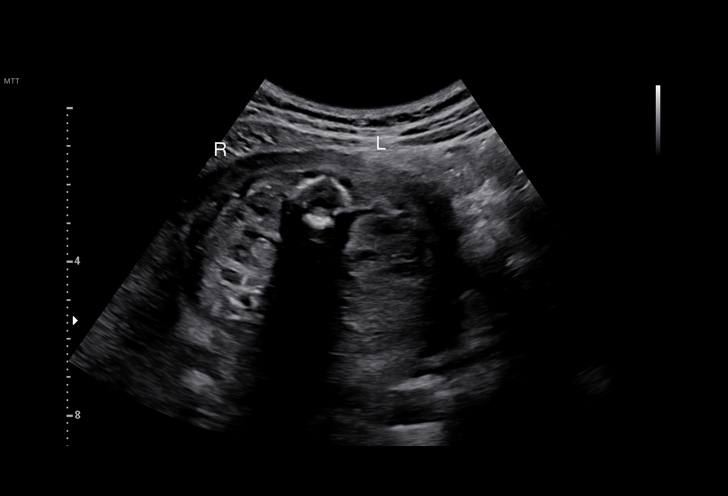
[im 10/53]
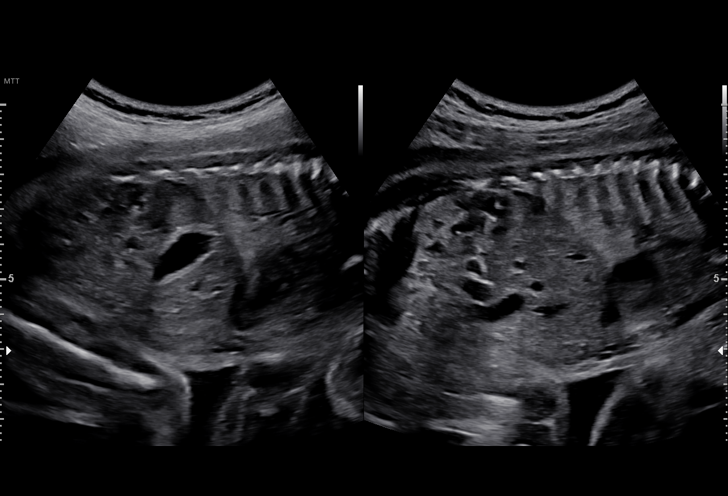
[im 14/53]
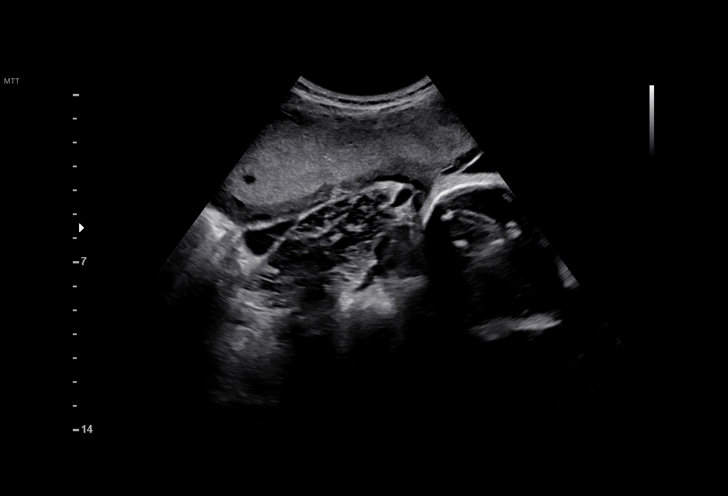
[im 18/53]
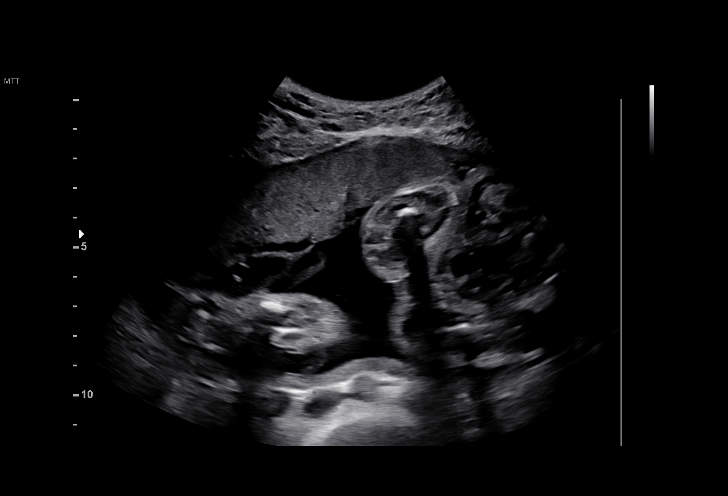
[im 22/53]
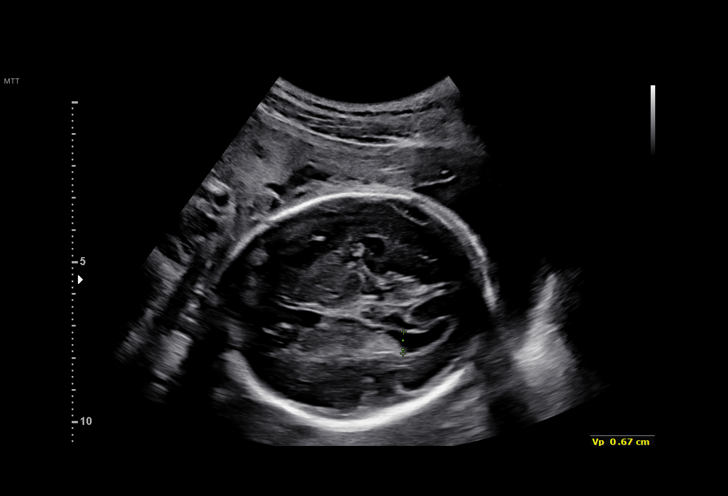
[im 26/53]
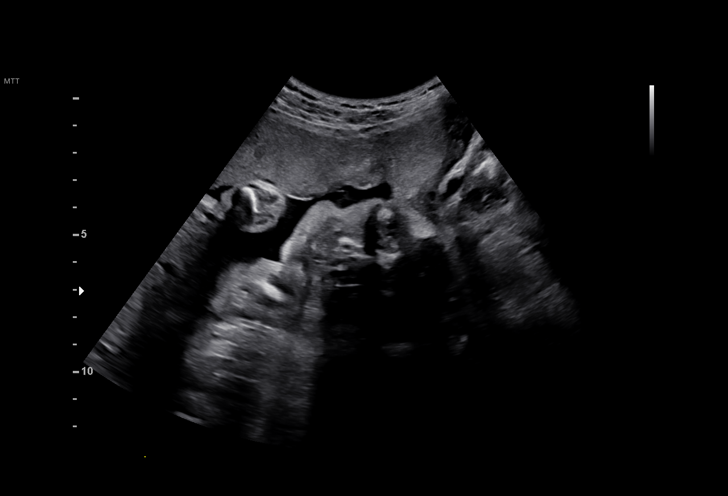
[im 29/53]
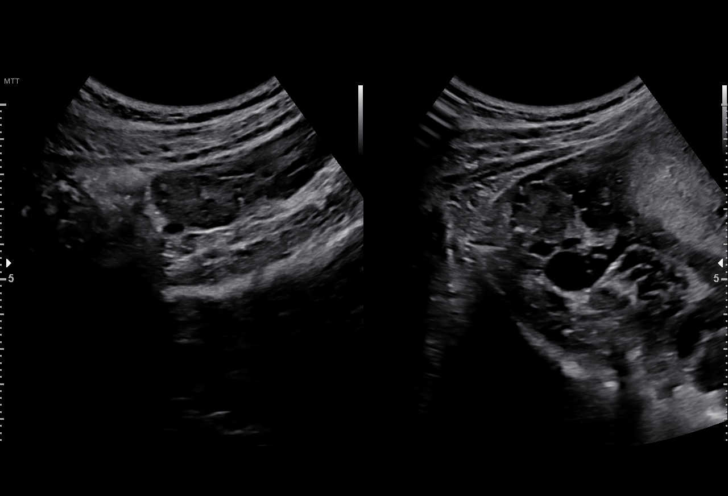
[im 33/53]
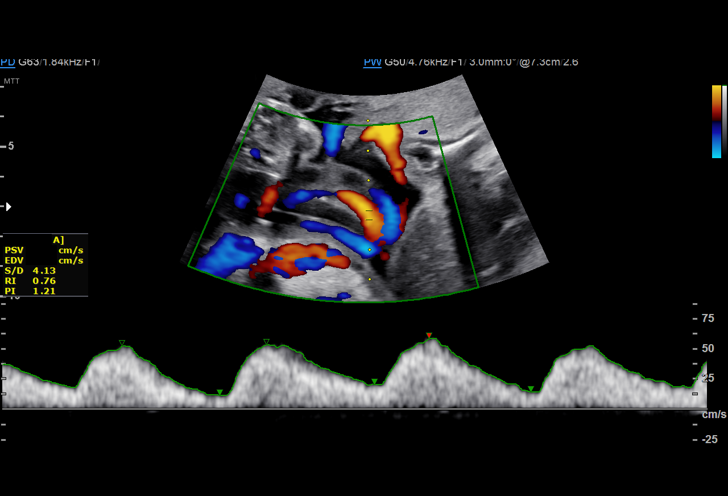
[im 37/53]
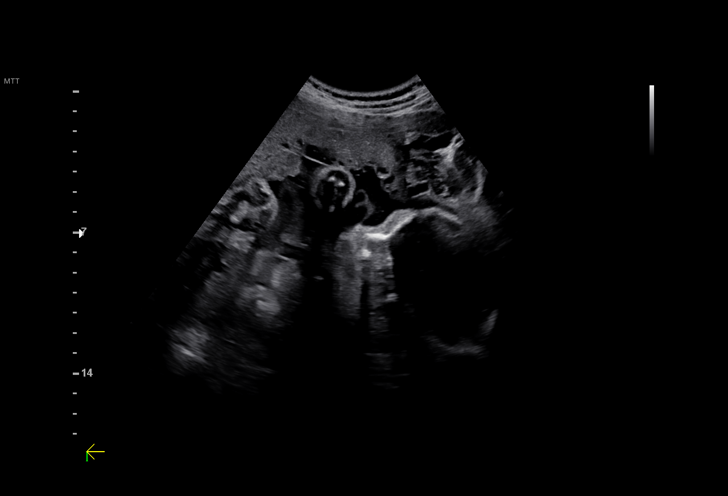
[im 41/53]
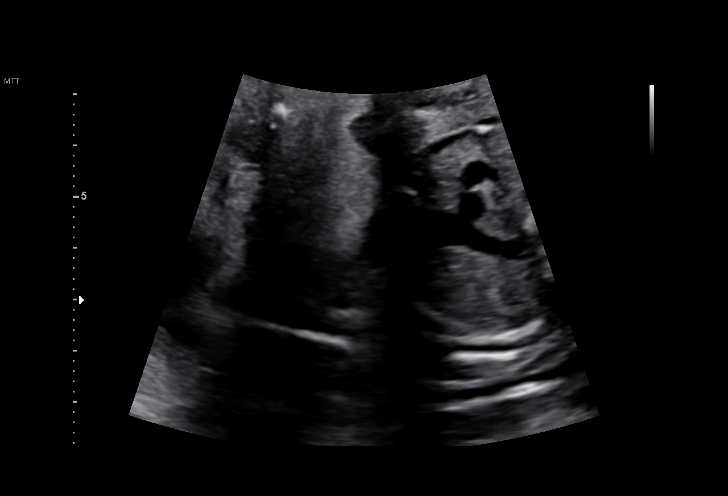
[im 45/53]
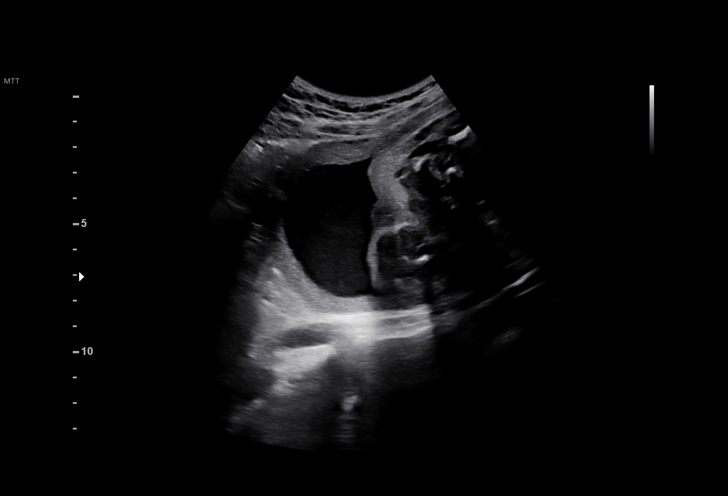
[im 49/53]
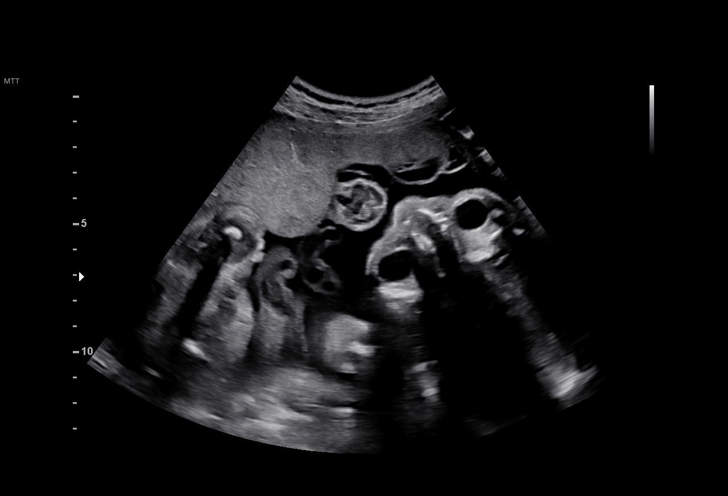
[im 53/53]
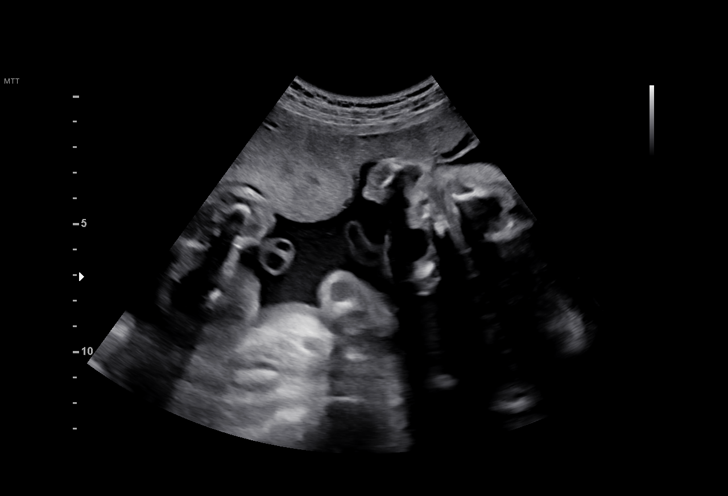

[14 of 28 positions shown; findings below may reference images not displayed]

#101

Indications

 Maternal care for known or suspected poor
 fetal growth, second trimester, not applicable
 or unspecified IUGR
 2 vessel umbilical cord
 27 weeks gestation of pregnancy
 Poor obstetric history: Previous preterm
 delivery, antepartum @ 33w
 Encounter for other antenatal screening
 follow-up
Fetal Evaluation

 Num Of Fetuses:         1
 Fetal Heart Rate(bpm):  145
 Cardiac Activity:       Observed
 Presentation:           Cephalic
 Placenta:               Anterior Fundal
 P. Cord Insertion:      Previously Visualized

 Amniotic Fluid
 AFI FV:      Within normal limits

                             Largest Pocket(cm)

Biometry

 LV:        6.7  mm
OB History
 Gravidity:    2         Term:   0        Prem:   1
 Living:       1
Gestational Age

 LMP:           27w 5d        Date:  02/16/19                 EDD:   11/23/19
 Best:          27w 5d     Det. By:  LMP  (02/16/19)          EDD:   11/23/19
Doppler - Fetal Vessels

 Umbilical Artery
  S/D     %tile      RI               PI                     ADFV    RDFV
  3.81       83    0.74              1.2                        No      No

Cervix Uterus Adnexa

 Cervix
 Not visualized (advanced GA >11wks)

 Uterus
 No abnormality visualized.

 Right Ovary
 Within normal limits. Normal, measuring

 Left Ovary
 Within normal limits. No adnexal mass visualized.

 Cul De Sac
 No free fluid seen.

 Adnexa
 No abnormality visualized.
Comments

 This patient was seen due to IUGR and a fetus with a two-
 vessel cord.  She denies any problems since her last exam
 and reports feeling fetal movements throughout the day.
 There was normal amniotic fluid noted on today's ultrasound
 exam.
 Doppler studies of the umbilical arteries performed due to
 fetal growth restriction showed a normal S/D ratio of 3.81.
 There were no signs of absent or reversed end-diastolic flow
 noted today.
 A follow-up exam was scheduled in 1 week to assess the
 fetal growth.  Should fetal growth restriction continue to be
 noted next week, we will start weekly fetal testing following
 that exam.

## 2020-08-06 IMAGING — US US MFM UA CORD DOPPLER
1 series · 13 of 28 positions shown · non-contrast
Comparison: none

[Series 1: us mfm ua cord doppler · 13 of 45 slices shown]
[im 2/45]
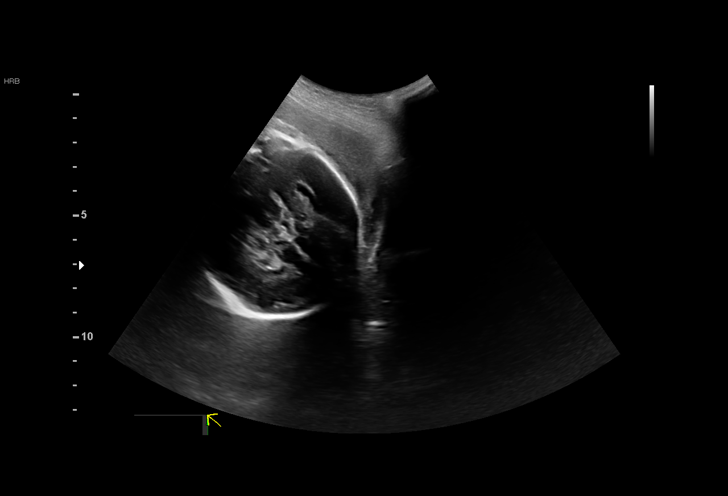
[im 5/45]
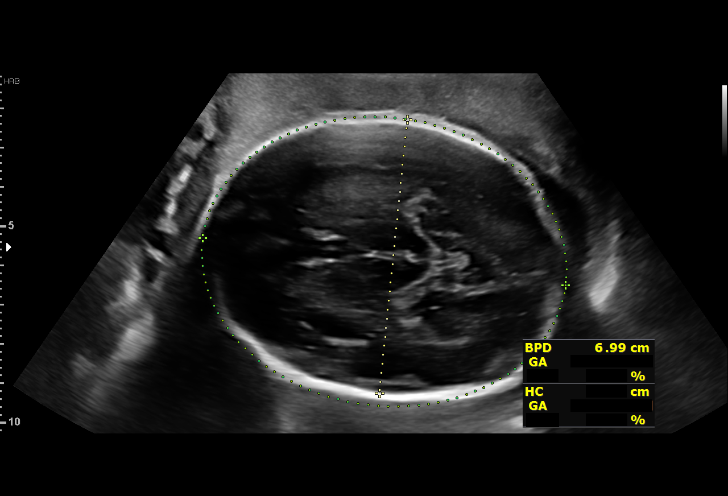
[im 9/45]
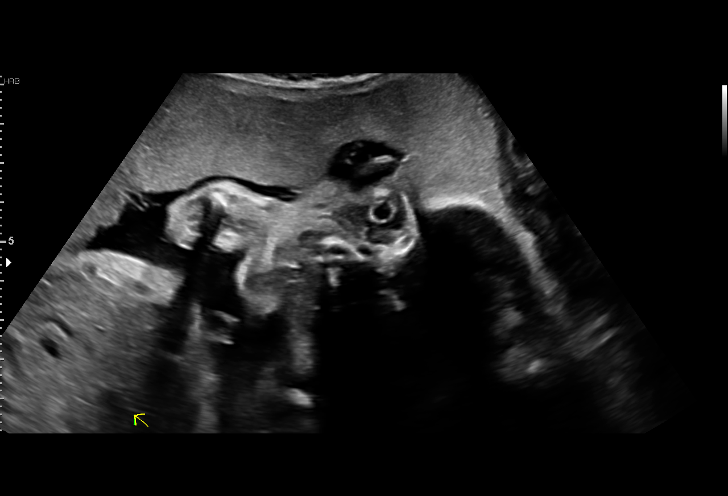
[im 12/45]
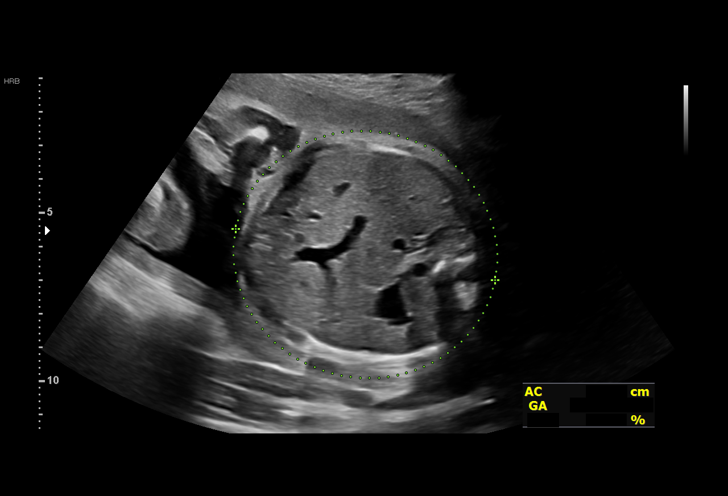
[im 15/45]
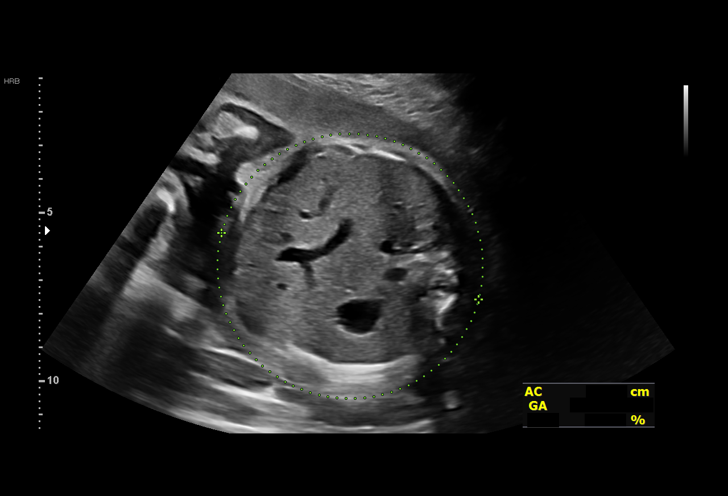
[im 18/45]
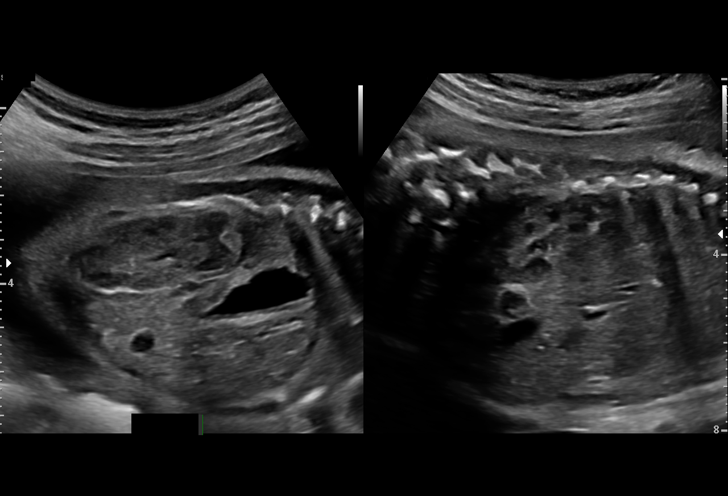
[im 23/45]
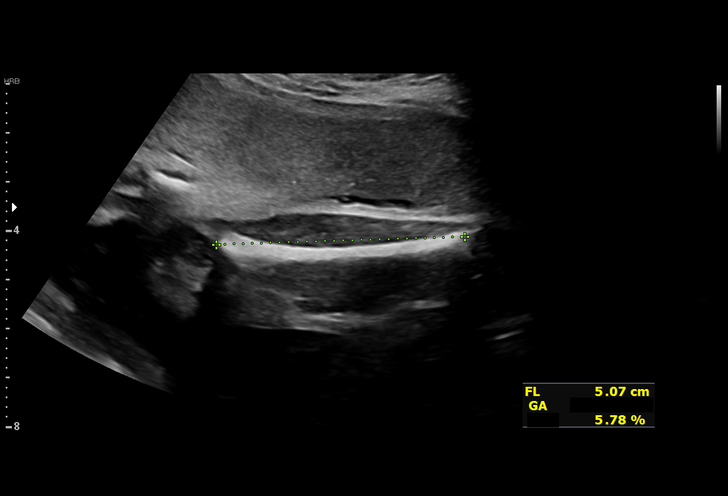
[im 27/45]
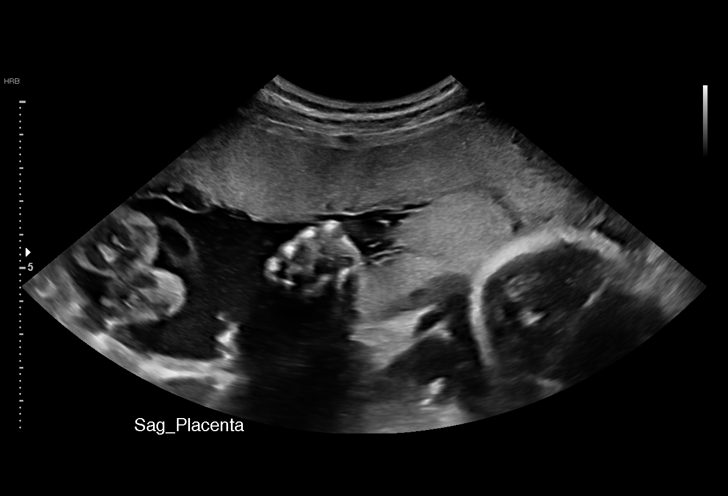
[im 30/45]
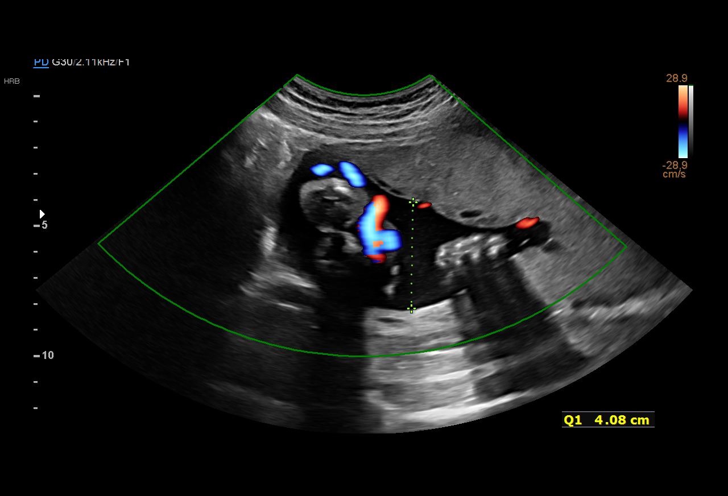
[im 33/45]
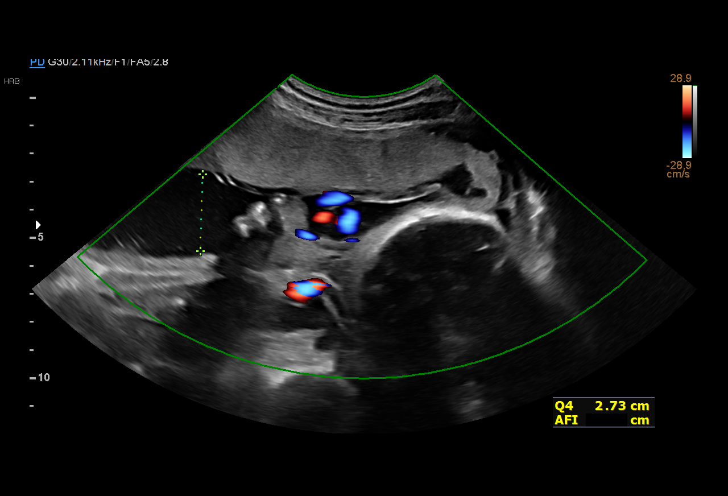
[im 36/45]
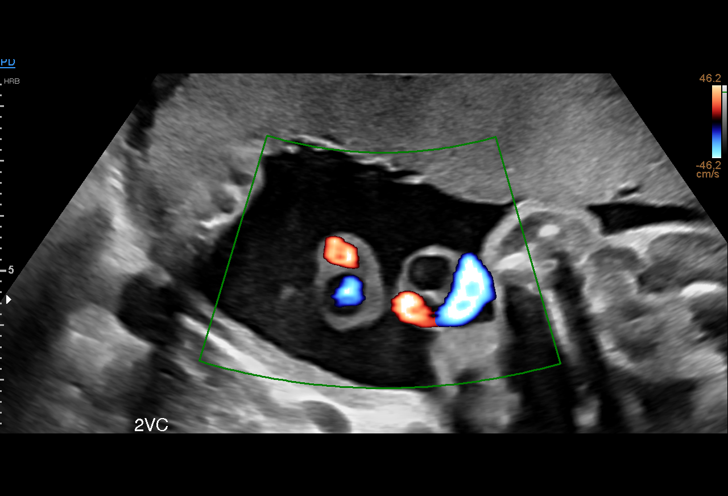
[im 40/45]
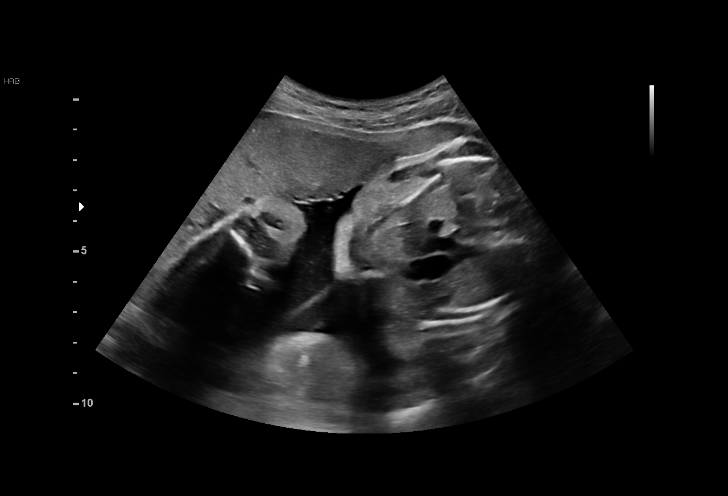
[im 43/45]
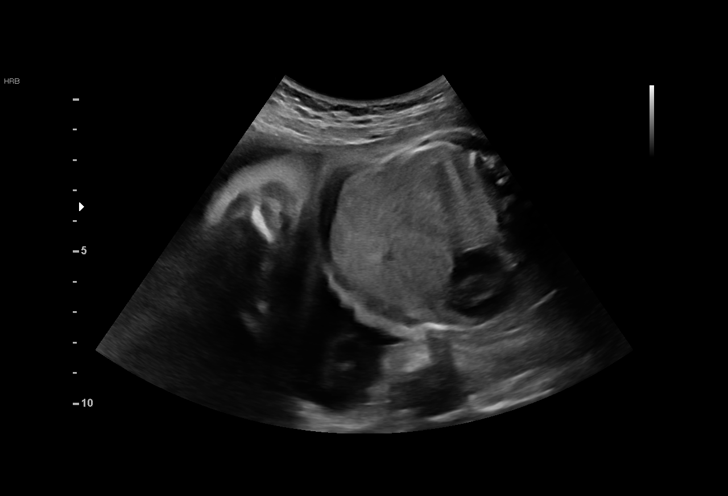

[13 of 28 positions shown; findings below may reference images not displayed]

Indications

 Maternal care for known or suspected poor
 fetal growth, second trimester, not applicable
 or unspecified IUGR
 2 vessel umbilical cord
 Poor obstetric history: Previous preterm
 delivery, antepartum @ 33w
 Encounter for other antenatal screening
 follow-up
 28 weeks gestation of pregnancy
Vital Signs

 Weight (lb):  95                                Height:        5'1"
 BMI:
Fetal Evaluation

 Num Of Fetuses:          1
 Fetal Heart Rate(bpm):   155
 Cardiac Activity:        Observed
 Presentation:            Cephalic
 Placenta:                Anterior Fundal
 P. Cord Insertion:       Previously Visualized

 Amniotic Fluid
 AFI FV:      Within normal limits

 AFI Sum(cm)     %Tile       Largest Pocket(cm)
 17.2            64
 RUQ(cm)       RLQ(cm)        LUQ(cm)        LLQ(cm)

Biometry

 BPD:      69.4   mm     G. Age:  27w 6d         15  %    CI:          71.1  %    70 - 86
                                                          FL/HC:       19.4  %    19.6 -
 HC:      262.2   mm     G. Age:  28w 4d         14  %    HC/AC:       1.05       0.99 -
 AC:      249.3   mm     G. Age:  29w 1d         57  %    FL/BPD:      73.2  %    71 - 87
 FL:       50.8   mm     G. Age:  27w 2d          6  %    FL/AC:       20.4  %    20 - 24

 LV:         6.2  mm

 Est. FW:    6068   gm    2 lb 11 oz      25  %
OB History

 Gravidity:     2         Term:  0          Prem:  1
 Living:        1
Gestational Age

 LMP:            28w 5d       Date:  02/16/19                   EDD:  11/23/19
 U/S Today:      28w 2d                                         EDD:  11/26/19
 Best:           28w 5d    Det. By:  LMP  (02/16/19)            EDD:  11/23/19
Anatomy

 Cranium:                Appears normal         Aortic Arch:            Previously seen
 Cavum:                  Appears normal         Ductal Arch:            Not well visualized
 Ventricles:             Appears normal         Diaphragm:              Appears normal
 Choroid Plexus:         Previously seen        Stomach:                Appears normal, left
                                                                        sided
 Cerebellum:             Previously seen        Abdomen:                Appears normal
 Posterior Fossa:        Previously seen        Abdominal Wall:         Previously seen
 Nuchal Fold:            Not applicable (>20    Cord Vessels:           2 Vessel Cord
                         wks GA)
 Face:                   Orbits nl; profile not Kidneys:                Appear normal
                         well visualized
 Lips:                   Appears normal         Bladder:                Appears normal
 Thoracic:               Appears normal         Spine:                  Previously seen
 Heart:                  Previously seen        Upper Extremities:      Previously seen
 RVOT:                   Previously seen        Lower Extremities:      Previously seen
 LVOT:                   Previously seen

 Other:   Technically difficult due to advanced GA and fetal position.
Doppler - Fetal Vessels

 Umbilical Artery
   S/D     %tile      RI                                      ADFV    RDFV
  3.23        65   0.69                                          No      No

Cervix Uterus Adnexa
 Cervix
 Not visualized (advanced GA >70wks)
Impression

 Follow up anatomy performed today given single umbilical artery
 and prior IUGR
 Good fetal movement and amniotic fluid
 Normal interval growth with EFW at th 25th%
Recommendations

 Follow up growth in 4 week due to single umbilical artery

## 2020-09-03 IMAGING — US US MFM OB FOLLOW-UP
1 series · 13 of 28 positions shown · non-contrast
Comparison: none

[Series 1: us mfm ob follow-up · 47 acquisitions, 13 frames shown]
[im 2/47]
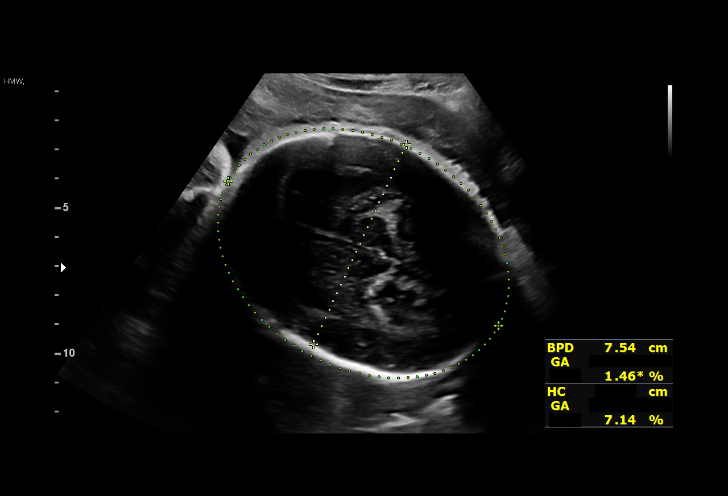
[im 6/47]
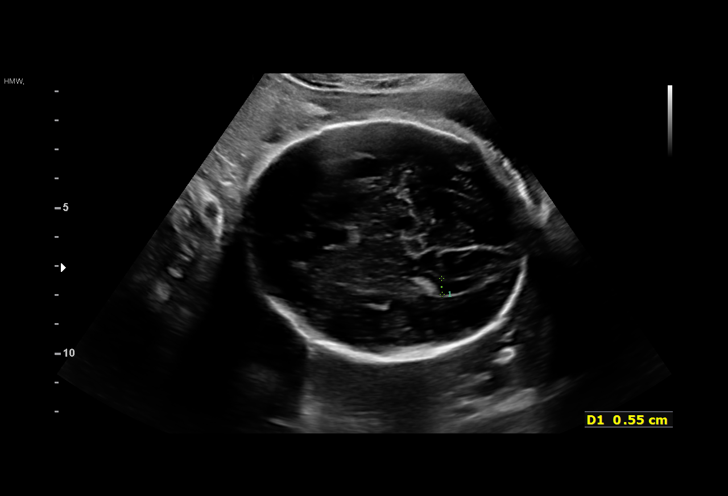
[im 9/47]
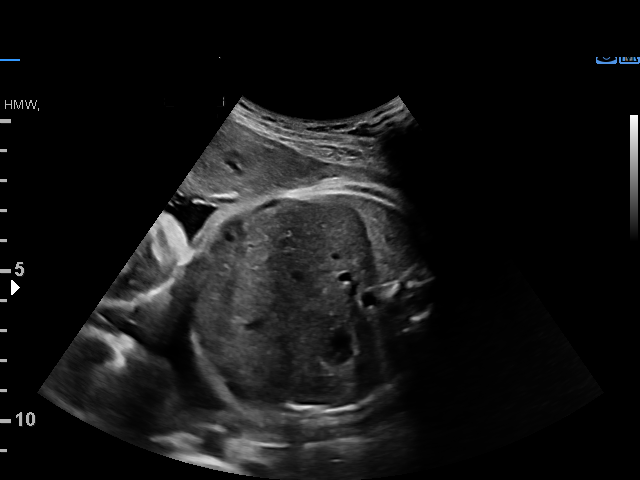
[im 12/47]
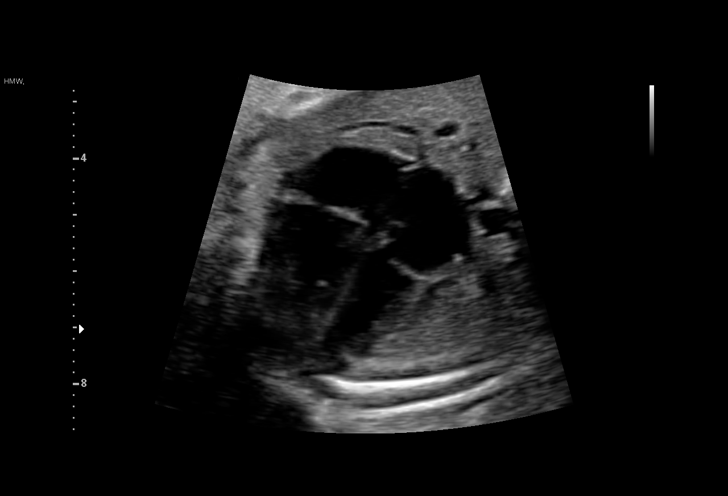
[im 16/47]
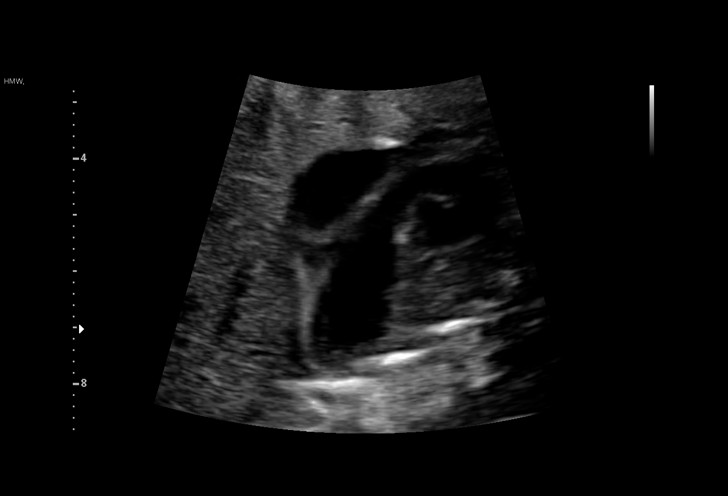
[im 19/47]
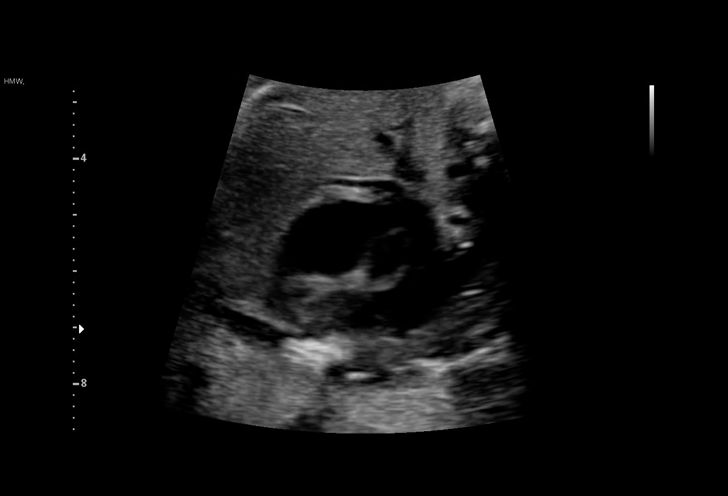
[im 24/47]
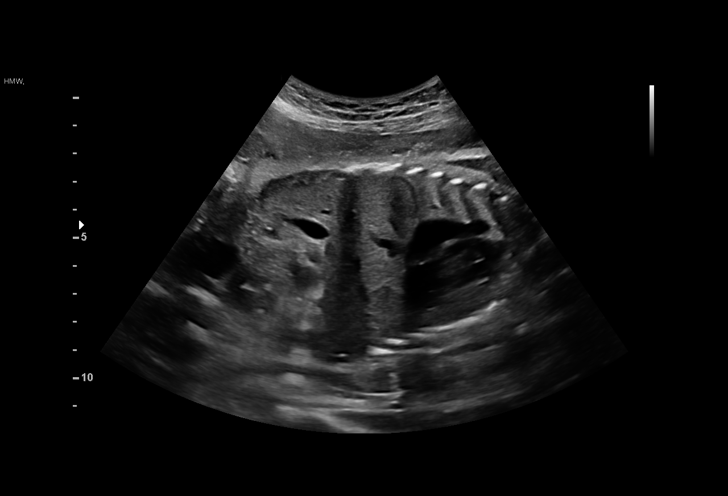
[im 28/47]
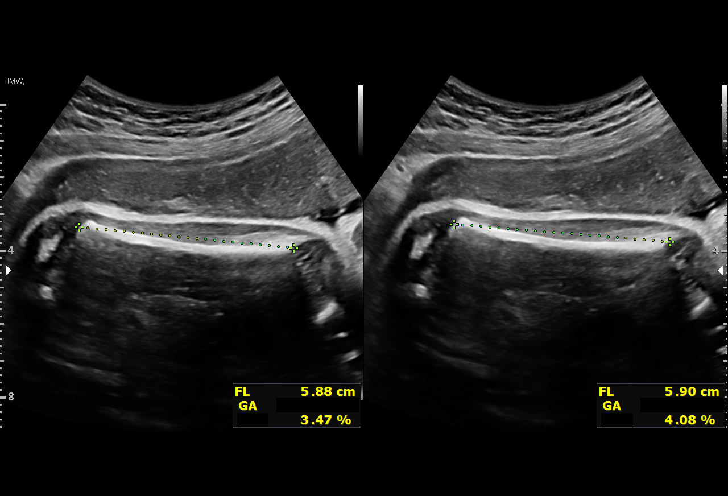
[im 31/47]
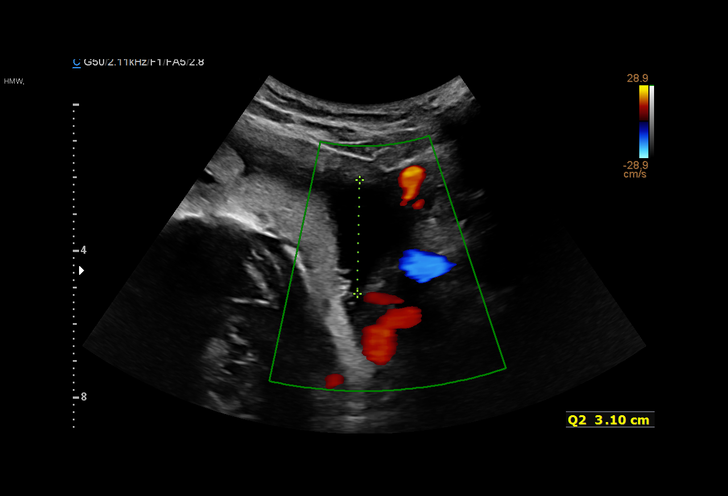
[im 35/47]
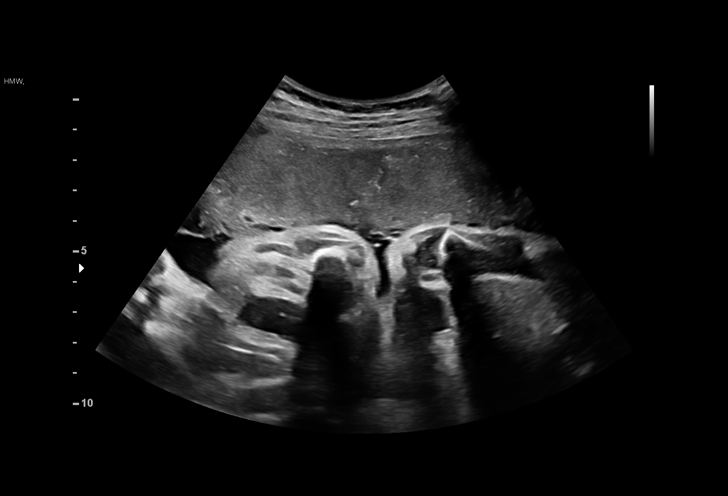
[im 38/47]
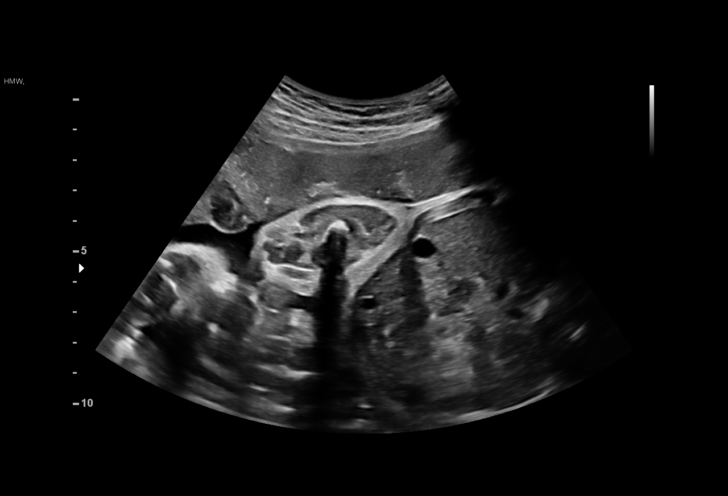
[im 41/47]
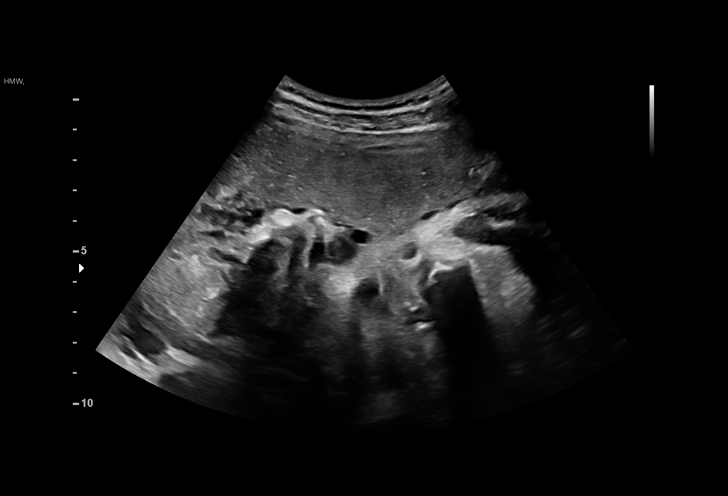
[im 45/47]
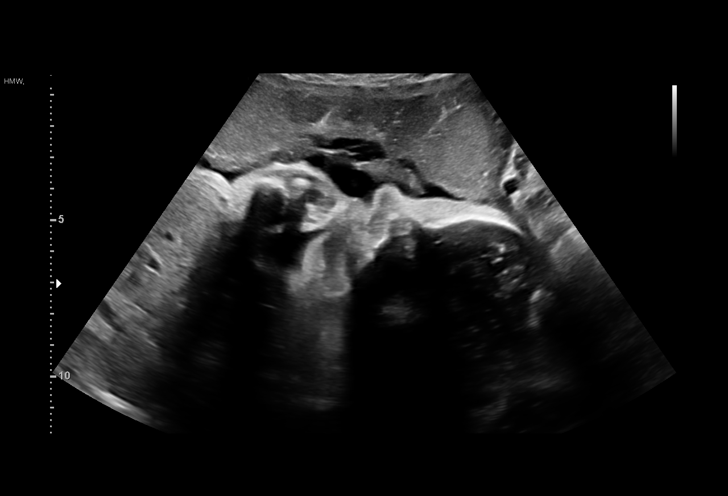

[13 of 28 positions shown; findings below may reference images not displayed]

#101

                                                      CASTRO
 2  US MFM UA CORD DOPPLER                76820.02    BARRERA
                                                      CASTRO

Indications

 Maternal care for known or suspected poor
 fetal growth, second trimester, not applicable
 or unspecified IUGR
 32 weeks gestation of pregnancy
 2 vessel umbilical cord
 Poor obstetric history: Previous preterm
 delivery, antepartum @ 33w
 Encounter for other antenatal screening
 follow-up
Vital Signs

                                                Height:        5'1"
Fetal Evaluation

 Num Of Fetuses:         1
 Fetal Heart Rate(bpm):  145
 Cardiac Activity:       Observed
 Presentation:           Cephalic
 Placenta:               Anterior
 P. Cord Insertion:      Previously Visualized

 Amniotic Fluid
 AFI FV:      Within normal limits

 AFI Sum(cm)     %Tile       Largest Pocket(cm)
 10.1            18

 RUQ(cm)       RLQ(cm)       LUQ(cm)        LLQ(cm)

Biometry

 BPD:      75.9  mm     G. Age:  30w 3d        2.1  %    CI:        69.47   %    70 - 86
                                                         FL/HC:      20.2   %    19.9 -
 HC:      290.7  mm     G. Age:  32w 0d          6  %    HC/AC:      1.05        0.96 -
 AC:      275.8  mm     G. Age:  31w 5d         20  %    FL/BPD:     77.5   %    71 - 87
 FL:       58.8  mm     G. Age:  30w 5d        3.6  %    FL/AC:      21.3   %    20 - 24

 Est. FW:    8808  gm    3 lb 13 oz       9  %
OB History

 Gravidity:    2         Term:   0        Prem:   1
 Living:       1
Gestational Age

 LMP:           32w 5d        Date:  02/16/19                 EDD:   11/23/19
 U/S Today:     31w 2d                                        EDD:   12/03/19
 Best:          32w 5d     Det. By:  LMP  (02/16/19)          EDD:   11/23/19
Anatomy

 Cranium:               Appears normal         Aortic Arch:            Previously seen
 Cavum:                 Appears normal         Ductal Arch:            Not well visualized
 Ventricles:            Appears normal         Diaphragm:              Previously seen
 Choroid Plexus:        Previously seen        Stomach:                Appears normal, left
                                                                       sided
 Cerebellum:            Previously seen        Abdomen:                Previously seen
 Posterior Fossa:       Previously seen        Abdominal Wall:         Previously seen
 Nuchal Fold:           Not applicable (>20    Cord Vessels:           2 Vessel Cord
                        wks GA)
 Face:                  Orbits previously      Kidneys:                Appear normal
                        seen
 Lips:                  Previously seen        Bladder:                Appears normal
 Thoracic:              Appears normal         Spine:                  Previously seen
 Heart:                 Appears normal         Upper Extremities:      Previously seen
                        (4CH, axis, and
                        situs)
 RVOT:                  Appears normal         Lower Extremities:      Previously seen
 LVOT:                  Appears normal

 Other:  Technically difficult due to advanced GA and fetal position.
Doppler - Fetal Vessels

 Umbilical Artery
  S/D     %tile      RI    %tile      PI    %tile            ADFV    RDFV
  2.86       64    0.65       70    1.02       75                N       N

Cervix Uterus Adnexa

 Cervix
 Not visualized (advanced GA >26wks)
Impression

 Ms. Blondinacka is here for follow up growth given a known single
 umbilical artery.
 The interval growth today suggest IUGR with EFW at the
 9th% but a normal AC.
 Interval growth is consistent at 1 pound.
 There is good fetal movement and aminotic fluid. The UA
 Dopplers are also normal.

 I discussed today's findings and recommend weekly antental
 testing with UA dopplers and serial growth.
Recommendations

 I recommend weekly antental testing with UA dopplers and
 serial growth.

## 2021-06-15 ENCOUNTER — Ambulatory Visit (INDEPENDENT_AMBULATORY_CARE_PROVIDER_SITE_OTHER): Payer: Medicaid Other | Admitting: Nurse Practitioner

## 2021-06-15 ENCOUNTER — Encounter: Payer: Self-pay | Admitting: Nurse Practitioner

## 2021-06-15 DIAGNOSIS — J0100 Acute maxillary sinusitis, unspecified: Secondary | ICD-10-CM | POA: Insufficient documentation

## 2021-06-15 MED ORDER — FLUTICASONE PROPIONATE 50 MCG/ACT NA SUSP: 2.0000 | Freq: Every day | NASAL | 0 refills | Status: DC

## 2021-06-15 MED ORDER — AMOXICILLIN 250 MG/5ML PO SUSR: 500.0000 mg | Freq: Three times a day (TID) | ORAL | 0 refills | Status: AC

## 2021-06-15 NOTE — Assessment & Plan Note (Signed)
Given length of symptoms and patient's presentation will elect to treat with amoxicillin 500 mg 3 times daily for 7 days.  Patient is currently breast-feeding.  Patient also requested liquid antibiotics if she does not do well with pills.  Did inform her recently there has been a shortage of liquid antibiotics.  If unable to obtain liquid we will switch to pill version.  Patient acknowledged and on board with treatment plan.  Patient can also use some Flonase to help out with the sinus pressure.  Follow-up if no improvement ?

## 2021-06-15 NOTE — Progress Notes (Signed)
? ? Patient ID: Carol Spears, female    DOB: September 05, 2000, 21 y.o.   MRN: [id number] ? ?Virtual visit completed through [employer], a video enabled telemedicine application. Due to national recommendations of social distancing due to COVID-19, a virtual visit is felt to be most appropriate for this patient at this time. Reviewed limitations, risks, security and privacy concerns of performing a virtual visit and the availability of in person appointments. I also reviewed that there may be a patient responsible charge related to this service. The patient agreed to proceed.  ? ?Patient location: home ?Provider location: Financial controller at [facility], office ?Persons participating in this virtual visit: patient, provider  ? ?If any vitals were documented, they were collected by patient at home unless specified below.   ? ?Breastfeeding Yes   ? ?CC: Sinus issue ?Subjective:  ? ?HPI: ?Carol Spears is a 21 y.o. female presenting on 06/15/2021 for Cough (Sx present for about 1 1/2 weeks-Runny nose, sinus pressure between eyes and above, post nasal drip, some sore throat in the morning, sneezing. Blowing out and coughing up green mucus. No fever. No Covid test done. Has taking Claritin and allergy relief medicaitons OTC, Tylenol.) ? ? ?Symptoms started approx 1-1.5 weeks ago ?States that she has been coughing up stuff for a while but turned green ?No covid test ?No covid vaccine ?No flu vaccine ?Her grandmother has been sick ?Clairin and tylenol OTC with no relief  ? ?No lmp while breastfeeding  ? ?States that she has tolerated Amoxicillin in the past ? ?Relevant past medical, surgical, family and social history reviewed and updated as indicated. Interim medical history since our last visit reviewed. ?Allergies and medications reviewed and updated. ?Outpatient Medications Prior to Visit  ?Medication Sig Dispense Refill  ? ibuprofen (ADVIL) 100 MG/5ML suspension Take 30 mLs (600 mg total) by mouth every 6 (six) hours.  450 mL 1  ? ?No facility-administered medications prior to visit.  ?  ? ?Per HPI unless specifically indicated in ROS section below ?Review of Systems  ?Constitutional:  Positive for appetite change and fatigue. Negative for chills and fever.  ?HENT:  Positive for sinus pressure and sinus pain. Negative for congestion, ear discharge, ear pain and sore throat (in the morning and improves through).   ?Respiratory:  Negative for cough (green color) and shortness of breath.   ?Cardiovascular:  Negative for chest pain.  ?Gastrointestinal:  Negative for abdominal pain, diarrhea, nausea and vomiting.  ?Musculoskeletal:  Negative for arthralgias and myalgias.  ?Neurological:  Positive for headaches.  ?Objective:  ?Breastfeeding Yes   ?Wt Readings from Last 3 Encounters:  ?11/02/19 117 lb 3.2 oz (53.2 kg)  ?09/24/19 111 lb (50.3 kg)  ?05/09/19 95 lb 11.2 oz (43.4 kg)  ?  ?  ? ?Physical exam: ?Gen: alert, NAD, not ill appearing ?Pulm: speaks in complete sentences without increased work of breathing ?Psych: normal mood, normal thought content  ? ?   ?Results for orders placed or performed during the hospital encounter of 11/02/19  ?OB RESULT CONSOLE Group B Strep  ?Result Value Ref Range  ? GBS Negative   ?CBC  ?Result Value Ref Range  ? WBC 9.3 4.0 - 10.5 K/uL  ? RBC 3.75 (L) 3.87 - 5.11 MIL/uL  ? Hemoglobin 11.4 (L) 12.0 - 15.0 g/dL  ? HCT 34.9 (L) 36.0 - 46.0 %  ? MCV 93.1 80.0 - 100.0 fL  ? MCH 30.4 26.0 - 34.0 pg  ? MCHC 32.7 30.0 - 36.0  g/dL  ? RDW 12.6 11.5 - 15.5 %  ? Platelets 145 (L) 150 - 400 K/uL  ? nRBC 0.0 0.0 - 0.2 %  ?RPR  ?Result Value Ref Range  ? RPR Ser Ql NON REACTIVE NON REACTIVE  ?CBC  ?Result Value Ref Range  ? WBC 9.6 4.0 - 10.5 K/uL  ? RBC 3.13 (L) 3.87 - 5.11 MIL/uL  ? Hemoglobin 9.9 (L) 12.0 - 15.0 g/dL  ? HCT 29.3 (L) 36.0 - 46.0 %  ? MCV 93.6 80.0 - 100.0 fL  ? MCH 31.6 26.0 - 34.0 pg  ? MCHC 33.8 30.0 - 36.0 g/dL  ? RDW 12.8 11.5 - 15.5 %  ? Platelets 120 (L) 150 - 400 K/uL  ? nRBC 0.0 0.0 - 0.2  %  ?OB RESULTS CONSOLE GC/Chlamydia  ?Result Value Ref Range  ? Gonorrhea Negative   ? Chlamydia Negative   ?OB RESULTS CONSOLE RPR  ?Result Value Ref Range  ? RPR Nonreactive   ?OB RESULTS CONSOLE HIV antibody  ?Result Value Ref Range  ? HIV Non-reactive   ?OB RESULTS CONSOLE Rubella Antibody  ?Result Value Ref Range  ? Rubella Immune   ?OB RESULTS CONSOLE Hepatitis B surface antigen  ?Result Value Ref Range  ? Hepatitis B Surface Ag Negative   ?Hepatitis C antibody  ?Result Value Ref Range  ? HCV Ab Negative   ?Type and screen  ?Result Value Ref Range  ? ABO/RH(D) A POS   ? Antibody Screen NEG   ? Sample Expiration    ?  11/05/2019,2359 ?Performed at [facility], [place] [address] [place], [place] [address] ?  ?Surgical pathology  ?Result Value Ref Range  ? SURGICAL PATHOLOGY    ?  SURGICAL PATHOLOGY ?CASE: [phone] ?PATIENT: Carol Spears ?Surgical Pathology Report ? ? ? ? ?Clinical History: IUGR (jmc) ? ? ? ? ?FINAL MICROSCOPIC DIAGNOSIS: ? ?A. PLACENTA, SINGLETON, DELIVERY: ?- Placenta, 388 g. ?- Two-vessel umbilical cord (single umbilical artery). ? ? ?GROSS DESCRIPTION: ?Specimen received: Singleton placenta, received fresh ?Size and shape: 16 x 15.5 x 2.5 cm, discoid ?Weight: 388 g excluding the cord and membranes ?Umbilical cord: Eccentrically inserted, 26 cm in length, uniform ?diameter and the cut surface shows 2 blood vessels. ?Membranes: Marginally inserted, smooth, tan-pink and translucent ?Fetal surface: Smooth, gray-blue ?Maternal surface: Spongy dark red with intact cotyledons ?Cut surface: Unremarkable ?Block summary: ?4 blocks submitted (GRP 11/04/2019) ? ?Final Diagnosis performed by Claudette Laws, MD.   Electronically signed ?11/05/2019 ?Technical component performed at [facility], [place] [address] [place], [place]. ? Professional component performed at [facility]. [place] [address], [place], [place] [address]. ? Immunohistochemistry  Technical component (if applicable) was performed ?at [facility]. [place], [address], ?[place], [place] [address].   IMMUNOHISTOCHEMISTRY DISCLAIMER (if applicable): ?Some of these immunohistochemical stains may have been developed and the ?performance characteristics determine by [facility]. Some ?may not have been cleared or approved by the U.S. Food and Drug ?Administration. The FDA has determined that such clearance or approval ?is not necessary. This test is used for clinical purposes. It should not ?be regarded as investigational or for research. This laboratory is ?certified under the Clinical Laboratory Improvement Amendments of 1988 ?(CLIA-88) as qualified to perform high complexity clinical laboratory ?testing.  The controls stained appropriately. ?  ? ?Assessment & Plan:  ? ?Problem List Items Addressed This Visit   ? ?  ? Respiratory  ?  Acute non-recurrent maxillary sinusitis - Primary  ?  Given length of symptoms and patient's presentation will elect to treat with amoxicillin 500 mg 3 times daily for 7 days.  Patient is currently breast-feeding.  Patient also requested liquid antibiotics if she does not do well with pills.  Did inform her recently there has been a shortage of liquid antibiotics.  If unable to obtain liquid we will switch to pill version.  Patient acknowledged and on board with treatment plan.  Patient can also use some Flonase to help out with the sinus pressure.  Follow-up if no improvement ?  ?  ? Relevant Medications  ? amoxicillin (AMOXIL) 250 MG/5ML suspension  ? fluticasone (FLONASE) 50 MCG/ACT nasal spray  ?  ? ?Meds ordered this encounter  ?Medications  ? amoxicillin (AMOXIL) 250 MG/5ML suspension  ?  Sig: Take 10 mLs (500 mg total) by mouth 3 (three) times daily for 7 days.  ?  Dispense:  210 mL  ?  Refill:  0  ?  Order Specific Question:   Supervising Provider  ?  Answer:   Loura Pardon A [[id number]]  ? fluticasone (FLONASE) 50 MCG/ACT nasal  spray  ?  Sig: Place 2 sprays into both nostrils daily.  ?  Dispense:  16 g  ?  Refill:  0  ?  Order Specific Question:   Supervising Provider  ?  Answer:   Loura Pardon A [[id number]]  ? ?No orders of the defined t

## 2021-07-08 ENCOUNTER — Other Ambulatory Visit: Payer: Self-pay | Admitting: Nurse Practitioner

## 2021-07-08 DIAGNOSIS — J0100 Acute maxillary sinusitis, unspecified: Secondary | ICD-10-CM

## 2021-07-20 ENCOUNTER — Ambulatory Visit: Payer: 59 | Admitting: Family

## 2021-07-20 ENCOUNTER — Ambulatory Visit (INDEPENDENT_AMBULATORY_CARE_PROVIDER_SITE_OTHER)
Admission: RE | Admit: 2021-07-20 | Discharge: 2021-07-20 | Disposition: A | Discharge status: Home / Self Care | Payer: Medicaid Other | Source: Ambulatory Visit | Attending: Family | Admitting: Family

## 2021-07-20 ENCOUNTER — Other Ambulatory Visit: Payer: Self-pay | Admitting: Family

## 2021-07-20 VITALS — BP 108/50 | HR 96 | Temp 98.3°F | Resp 16 | Ht 61.0 in | Wt 98.5 lb

## 2021-07-20 DIAGNOSIS — M25561 Pain in right knee: Secondary | ICD-10-CM

## 2021-07-20 NOTE — Patient Instructions (Addendum)
Complete xray(s) prior to leaving today. I will notify you of your results once received. ?Recommend compression sleeve for you knee, rest ice as needed. ? ?A referral was placed today for an orthopedist. ?Please let us know if you have not heard back within 2 weeks about the referral. ? ? ?Due to recent changes in healthcare laws, you may see results of your imaging and/or laboratory studies on MyChart before I have had a chance to review them.  I understand that in some cases there may be results that are confusing or concerning to you. Please understand that not all results are received at the same time and often I may need to interpret multiple results in order to provide you with the best plan of care or course of treatment. Therefore, I ask that you please give me 2 business days to thoroughly review all your results before contacting my office for clarification. Should we see a critical lab result, you will be contacted sooner.  ? ?It was a pleasure seeing you today! Please do not hesitate to reach out with any questions and or concerns. ? ?Regards,  ? ?Duval Macleod ?FNP-C ? ?

## 2021-07-20 NOTE — Assessment & Plan Note (Signed)
Right knee xray pending results ?Compression sleeve daily ?RICE  ?NSAIDS prn  ?Referral to ortho as chronic and pt states pain worsening, may need MRI pending results of XRAY ?

## 2021-07-20 NOTE — Progress Notes (Signed)
? ?Established Patient Office Visit ? ?Subjective:  ?Patient ID: Carol Spears, female    DOB: 17-Nov-2000  Age: 21 y.o. MRN: [id number] ? ?CC:  ?Chief Complaint  ?Patient presents with  ? Knee Pain  ?  X several months swollen  ? ? ?HPI ?Carol Spears is here today with concerns. ? ?Knee pain has been ongoing for several months.  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  ?Right knee pain been ongoing for awhile now and some pain on right right upper patella,as well as feels like a knot of tenderness left upper inner knee.  ? ?Past Medical History:  ?Diagnosis Date  ? Anxiety   ? History of kidney stones   ? Myopia 01/03/2012  ? Wears glasses  ? Premature birth 11/24/2015  ? 36 weeks Birth wt 4 lb 5 oz Hx of maternal depression Rx with prozac during pregnancy  ? ? ?Past Surgical History:  ?Procedure Laterality Date  ? LITHOTRIPSY    ? TYMPANOSTOMY TUBE PLACEMENT    ? ? ?Family History  ?Problem Relation Age of Onset  ? Depression Mother   ? Hypertension Mother   ? Hyperlipidemia Maternal Grandmother   ? Hypertension Maternal Grandmother   ? COPD Maternal Grandmother   ? Depression Maternal Grandmother   ? Hyperlipidemia Maternal Grandfather   ? Heart disease Maternal Grandfather   ? Diabetes Maternal Grandfather   ? COPD Maternal Grandfather   ? Hypertension Paternal Grandmother   ? Hyperlipidemia Paternal Grandfather   ? Heart disease Paternal Grandfather   ? Thyroid disease Maternal Aunt   ? Alcohol abuse Neg Hx   ? Arthritis Neg Hx   ? Asthma Neg Hx   ? Birth defects Neg Hx   ? Cancer Neg Hx   ? Drug abuse Neg Hx   ? Early death Neg Hx   ? Hearing loss Neg Hx   ? Kidney disease Neg Hx   ? Learning disabilities Neg Hx   ? Mental illness Neg Hx   ? Mental retardation Neg Hx   ? Miscarriages / Stillbirths Neg Hx   ? Stroke Neg Hx   ? Vision loss Neg Hx   ? Varicose Veins Neg Hx   ? ? ?Social History  ? ?Socioeconomic History  ? Marital status:  Single  ?  Spouse name: Not on file  ? Number of children: Not on file  ? Years of education: Not on file  ? Highest education level: Not on file  ?Occupational History  ? Not on file  ?Tobacco Use  ? Smoking status: Former  ? Smokeless tobacco: Never  ?Vaping Use  ? Vaping Use: Never used  ?Substance and Sexual Activity  ? Alcohol use: No  ? Drug use: No  ? Sexual activity: Not Currently  ?Other Topics Concern  ? Not on file  ?Social History Narrative  ? Single.  ? Works at [employer].   ? Enjoys watching Netflix.   ? ?Social Determinants of Health  ? ?Financial Resource Strain: Not on file  ?Food Insecurity: Not on file  ?Transportation Needs: Not on file  ?Physical Activity: Not on file  ?Stress: Not on file  ?Social Connections: Not on file  ?Intimate Partner Violence: Not on file  ? ? ?Outpatient Medications Prior to Visit  ?Medication Sig Dispense Refill  ? fluticasone (FLONASE) 50 MCG/ACT nasal spray Place 2 sprays into both nostrils daily. (Patient not taking: Reported  on 07/20/2021) 16 g 0  ? ibuprofen (ADVIL) 100 MG/5ML suspension Take 30 mLs (600 mg total) by mouth every 6 (six) hours. (Patient not taking: Reported on 07/20/2021) 450 mL 1  ? ?No facility-administered medications prior to visit.  ? ? ?Allergies  ?Allergen Reactions  ? Augmentin [Amoxicillin-Pot Clavulanate] Diarrhea  ?  21 years old ?Has patient had a PCN reaction causing immediate rash, facial/tongue/throat swelling, SOB or lightheadedness with hypotension: No ?Has patient had a PCN reaction causing severe rash involving mucus membranes or skin necrosis: No ?Has patient had a PCN reaction that required hospitalization No ?Has patient had a PCN reaction occurring within the last 10 years: No ?If all of the above answers are "NO", then may proceed with Cephalosporin use. ?  ? ? ?ROS ?Review of Systems  ?Constitutional:  Negative for chills, fatigue and fever.  ?Musculoskeletal:  Positive for arthralgias (right knee pain, ongoing, worsening in  the last few months, no known injury or trauma). Negative for back pain and myalgias.  ? ?  ?Objective:  ?  ?Physical Exam ?Constitutional:   ?   General: She is not in acute distress. ?   Appearance: Normal appearance. She is normal weight. She is not ill-appearing, toxic-appearing or diaphoretic.  ?Pulmonary:  ?   Effort: Pulmonary effort is normal.  ?Musculoskeletal:  ?   Right knee: Swelling and effusion (right lower lateral patella and left upper patella) present. No bony tenderness. Normal range of motion. Tenderness present over the lateral joint line and patellar tendon. No PCL laxity. Normal patellar mobility.  ?   Left knee: Normal.  ?Neurological:  ?   Mental Status: She is alert.  ? ? ?BP (!) 108/50   Pulse 96   Temp 98.3 ?F (36.8 ?C)   Resp 16   Ht 5\' 1"  (1.549 m)   Wt 98 lb 8 oz (44.7 kg)   SpO2 97%   Breastfeeding Yes   BMI 18.61 kg/m?  ?Wt Readings from Last 3 Encounters:  ?07/20/21 98 lb 8 oz (44.7 kg)  ?11/02/19 117 lb 3.2 oz (53.2 kg)  ?09/24/19 111 lb (50.3 kg)  ? ? ? ?Health Maintenance Due  ?Topic Date Due  ? COVID-19 Vaccine (1) Never done  ? PAP-Cervical Cytology Screening  Never done  ? PAP SMEAR-Modifier  Never done  ? TETANUS/TDAP  05/17/2020  ? ? ?There are no preventive care reminders to display for this patient. ? ?Lab Results  ?Component Value Date  ? TSH 1.79 11/08/2016  ? ?Lab Results  ?Component Value Date  ? WBC 9.6 11/03/2019  ? HGB 9.9 (L) 11/03/2019  ? HCT 29.3 (L) 11/03/2019  ? MCV 93.6 11/03/2019  ? PLT 120 (L) 11/03/2019  ? ?Lab Results  ?Component Value Date  ? NA 136 05/09/2019  ? K 3.6 05/09/2019  ? CO2 20 (L) 05/09/2019  ? GLUCOSE 70 05/09/2019  ? BUN 8 05/09/2019  ? CREATININE 0.55 05/09/2019  ? BILITOT 1.1 05/09/2019  ? ALKPHOS 66 05/09/2019  ? AST 22 05/09/2019  ? ALT 17 05/09/2019  ? PROT 8.3 (H) 05/09/2019  ? ALBUMIN 5.1 (H) 05/09/2019  ? CALCIUM 10.2 05/09/2019  ? ANIONGAP 15 05/09/2019  ? GFR 99.84 11/08/2016  ? ?No results found for: HGBA1C ? ?   ?Assessment & Plan:  ? ?Problem List Items Addressed This Visit   ? ?  ? Other  ? Acute knee pain - Primary  ?  Right knee xray pending results ?Compression sleeve daily ?RICE  ?  NSAIDS prn  ?Referral to ortho as chronic and pt states pain worsening, may need MRI pending results of XRAY ? ?  ?  ? Relevant Orders  ? DG Knee Complete 4 Views Right  ? AMB referral to orthopedics  ? ? ?No orders of the defined types were placed in this encounter. ? ? ?Follow-up: Return if symptoms worsen or fail to improve.  ? ? ?Mort Sawyersabitha Amire Gossen, FNP ?

## 2021-07-23 NOTE — Progress Notes (Signed)
Right knee is without any acute abn however may need closer look with MRI which would be with orthopedist. Make appt, referal has already been placed with orthopedist at visit.

## 2021-07-26 ENCOUNTER — Ambulatory Visit: Payer: Medicaid Other | Admitting: Physician Assistant

## 2021-07-26 ENCOUNTER — Telehealth: Payer: Self-pay | Admitting: Family

## 2021-07-26 NOTE — Progress Notes (Signed)
Referral is active, so would not need new referral. Just would need to call and schedule have her keep appt with ortho regardless of how it feels.

## 2021-07-26 NOTE — Telephone Encounter (Signed)
Pt called [facility] and has appt for tomorrow 07/27/21. They would like a referral sent to them please ? ?Ph# for pt [phone] ?

## 2021-07-27 ENCOUNTER — Ambulatory Visit: Payer: Medicaid Other | Admitting: Physician Assistant

## 2021-08-03 ENCOUNTER — Ambulatory Visit (INDEPENDENT_AMBULATORY_CARE_PROVIDER_SITE_OTHER): Payer: Medicaid Other | Admitting: Orthopaedic Surgery

## 2021-08-03 ENCOUNTER — Encounter: Payer: Self-pay | Admitting: Orthopaedic Surgery

## 2021-08-03 VITALS — Ht 61.0 in | Wt 95.0 lb

## 2021-08-03 DIAGNOSIS — G8929 Other chronic pain: Secondary | ICD-10-CM

## 2021-08-03 DIAGNOSIS — M25561 Pain in right knee: Secondary | ICD-10-CM | POA: Diagnosis not present

## 2021-08-03 NOTE — Progress Notes (Signed)
? ?Office Visit Note ?  ?Patient: Carol Spears           ?Date of Birth: 2000/09/16           ?MRN: [id number] ?Visit Date: 08/03/2021 ?             ?Requested by: Pleas Koch, NP ?[address] ?[place],  [place] [address] ?PCP: Pleas Koch, NP ? ? ?Assessment & Plan: ?Visit Diagnoses:  ?1. Chronic pain of right knee   ? ? ?Plan: Ms. celedon relates at least 4 to 55-month history of right knee pain with occasional swelling.  She denies any history of injury or trauma.  She denies other joint complaints or skin rashes.  She has not had any fever or chills.  There is no history of instability.  I did review the films of her right knee and the PACS system and thought they were normal without any obvious acute changes.  There was no ectopic calcification.  I thought on her prior films she may have had an effusion but did not think that compared to her left knee that there was any effusion today.  The knee was not hot warm or red.  I will order an MRI scan of her knee.  Ending up on the results I think rheumatologic evaluation may be worthwhile.  She does have a 22-year-old but relates not having any problem during her pregnancy or even after delivery with her right knee. ? ?Follow-Up Instructions: Return After MRI scan right knee.  ? ?Orders:  ?Orders Placed This Encounter  ?Procedures  ? MR Knee Right w/o contrast  ? ?No orders of the defined types were placed in this encounter. ? ? ? ? Procedures: ?No procedures performed ? ? ?Clinical Data: ?No additional findings. ? ? ?Subjective: ?Chief Complaint  ?Patient presents with  ? Right Knee - Pain  ?Patient presents today for right knee pain. She said that it started to hurt several months ago, with no injury. Her pain is located laterally. She said that it feels like she cannot fully extend it. It does swell. She takes Ibuprofen as needed, but states that it does not seem to really help.  ? ?HPI ? ?Review of Systems ? ? ?Objective: ?Vital Signs: Ht 5\' 1"   (1.549 m)   Wt 95 lb (43.1 kg)   BMI 17.95 kg/m?  ? ?Physical Exam ?Constitutional:   ?   Appearance: She is well-developed.  ?Eyes:  ?   Pupils: Pupils are equal, round, and reactive to light.  ?Pulmonary:  ?   Effort: Pulmonary effort is normal.  ?Skin: ?   General: Skin is warm and dry.  ?Neurological:  ?   Mental Status: She is alert and oriented to person, place, and time.  ?Psychiatric:     ?   Behavior: Behavior normal.  ? ? ?Ortho Exam right knee was not hot warm or red.  I do not think she has an effusion but there was a little bogginess to the knee but similar in the left knee.  I could not locate any specific areas of tenderness.  She was hypermobile and that she could hyperextend her elbow and her knee and her patellae were very mobile but not painful.  She does not have a history of patella subluxation or dislocation.  No medial or lateral joint pain.  No opening with varus or valgus stress.  No popliteal pain or mass.  Neurologically intact. ? ?Specialty Comments:  ?  No specialty comments available. ? ?Imaging: ?No results found. ? ? ?PMFS History: ?Patient Active Problem List  ? Diagnosis Date Noted  ? Pain in right knee 08/03/2021  ? IUGR (intrauterine growth restriction) affecting care of mother, third trimester, fetus 1 11/02/2019  ? NSVD (normal spontaneous vaginal delivery) 11/02/2019  ? Breast mass 11/19/2018  ? Acute knee pain 11/19/2018  ? Acute neck pain 11/01/2018  ? Near syncope 04/05/2017  ? Stress 04/05/2017  ? Back pain 11/08/2016  ? Underweight 11/08/2016  ? 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 Adjustment disorder with mixed anxiety and depressed mood 12/26/2012  ? Family dynamics problem 12/26/2012  ? Myopia 01/03/2012  ? Anxiety 09/02/2011  ? ADD (attention deficit disorder) 10/27/2010  ? ?Past Medical History:  ?Diagnosis Date  ? Anxiety   ? History of kidney stones   ? Myopia 01/03/2012  ? Wears glasses  ? Premature birth 11/24/2015  ? 36  weeks Birth wt 4 lb 5 oz Hx of maternal depression Rx with prozac during pregnancy  ?  ?Family History  ?Problem Relation Age of Onset  ? Depression Mother   ? Hypertension Mother   ? Hyperlipidemia Maternal Grandmother   ? Hypertension Maternal Grandmother   ? COPD Maternal Grandmother   ? Depression Maternal Grandmother   ? Hyperlipidemia Maternal Grandfather   ? Heart disease Maternal Grandfather   ? Diabetes Maternal Grandfather   ? COPD Maternal Grandfather   ? Hypertension Paternal Grandmother   ? Hyperlipidemia Paternal Grandfather   ? Heart disease Paternal Grandfather   ? Thyroid disease Maternal Aunt   ? Alcohol abuse Neg Hx   ? Arthritis Neg Hx   ? Asthma Neg Hx   ? Birth defects Neg Hx   ? Cancer Neg Hx   ? Drug abuse Neg Hx   ? Early death Neg Hx   ? Hearing loss Neg Hx   ? Kidney disease Neg Hx   ? Learning disabilities Neg Hx   ? Mental illness Neg Hx   ? Mental retardation Neg Hx   ? Miscarriages / Stillbirths Neg Hx   ? Stroke Neg Hx   ? Vision loss Neg Hx   ? Varicose Veins Neg Hx   ?  ?Past Surgical History:  ?Procedure Laterality Date  ? LITHOTRIPSY    ? TYMPANOSTOMY TUBE PLACEMENT    ? ?Social History  ? ?Occupational History  ? Not on file  ?Tobacco Use  ? Smoking status: Former  ? Smokeless tobacco: Never  ?Vaping Use  ? Vaping Use: Never used  ?Substance and Sexual Activity  ? Alcohol use: No  ? Drug use: No  ? Sexual activity: Not Currently  ? ? ? ? ? ? ?

## 2021-08-31 ENCOUNTER — Encounter: Payer: Self-pay | Admitting: *Deleted

## 2022-02-16 ENCOUNTER — Ambulatory Visit: Payer: Medicaid Other | Admitting: Family

## 2022-08-03 ENCOUNTER — Ambulatory Visit: Payer: Medicaid Other | Admitting: Internal Medicine

## 2022-08-03 ENCOUNTER — Encounter: Payer: Self-pay | Admitting: Internal Medicine

## 2022-08-03 VITALS — BP 100/60 | HR 97 | Temp 97.5°F | Ht 61.0 in | Wt 104.0 lb

## 2022-08-03 DIAGNOSIS — H6691 Otitis media, unspecified, right ear: Secondary | ICD-10-CM | POA: Insufficient documentation

## 2022-08-03 DIAGNOSIS — H6641 Suppurative otitis media, unspecified, right ear: Secondary | ICD-10-CM

## 2022-08-03 HISTORY — DX: Otitis media, unspecified, right ear: H66.91

## 2022-08-03 MED ORDER — AMOXICILLIN 400 MG/5ML PO SUSR: 1000.0000 mg | Freq: Two times a day (BID) | ORAL | 1 refills | Status: DC

## 2022-08-03 NOTE — Assessment & Plan Note (Addendum)
Likely in setting of sinusitis Will try amoxil 1000 mg bid x 7 days ---but may need to change to cephalosporin if doesn't resolve (can't take augmentin) Discussed ibuprofen 400-600 three times a day or naproxen 440 twice a day for the pain

## 2022-08-03 NOTE — Progress Notes (Signed)
Subjective:    Patient ID: Carol Spears, female    DOB: 2000/08/07, 22 y.o.   MRN: [id number]  HPI Here due to right ear pain  Started 2 days ago Had some blood from right nostril this morning Seems to have allergy symptoms for 2 weeks---sneezing and coughing Post nasal drip---brings up some green stuff No fever No headache No SOB  No current outpatient medications on file prior to visit.   No current facility-administered medications on file prior to visit.    Allergies  Allergen Reactions   Augmentin [Amoxicillin-Pot Clavulanate] Diarrhea    22 years old Has patient had a PCN reaction causing immediate rash, facial/tongue/throat swelling, SOB or lightheadedness with hypotension: No Has patient had a PCN reaction causing severe rash involving mucus membranes or skin necrosis: No Has patient had a PCN reaction that required hospitalization No Has patient had a PCN reaction occurring within the last 10 years: No If all of the above answers are "NO", then may proceed with Cephalosporin use.     Past Medical History:  Diagnosis Date   Anxiety    History of kidney stones    Myopia 01/03/2012   Wears glasses   Premature birth 11/24/2016   36 weeks Birth wt 4 lb 5 oz Hx of maternal depression Rx with prozac during pregnancy    Past Surgical History:  Procedure Laterality Date   LITHOTRIPSY     TYMPANOSTOMY TUBE PLACEMENT      Family History  Problem Relation Age of Onset   Depression Mother    Hypertension Mother    Hyperlipidemia Maternal Grandmother    Hypertension Maternal Grandmother    COPD Maternal Grandmother    Depression Maternal Grandmother    Hyperlipidemia Maternal Grandfather    Heart disease Maternal Grandfather    Diabetes Maternal Grandfather    COPD Maternal Grandfather    Hypertension Paternal Grandmother    Hyperlipidemia Paternal Grandfather    Heart disease Paternal Grandfather    Thyroid disease Maternal Aunt    Alcohol abuse Neg  Hx    Arthritis Neg Hx    Asthma Neg Hx    Birth defects Neg Hx    Cancer Neg Hx    Drug abuse Neg Hx    Early death Neg Hx    Hearing loss Neg Hx    Kidney disease Neg Hx    Learning disabilities Neg Hx    Mental illness Neg Hx    Mental retardation Neg Hx    Miscarriages / Stillbirths Neg Hx    Stroke Neg Hx    Vision loss Neg Hx    Varicose Veins Neg Hx     Social History   Socioeconomic History   Marital status: Single    Spouse name: Not on file   Number of children: Not on file   Years of education: Not on file   Highest education level: Not on file  Occupational History   Not on file  Tobacco Use   Smoking status: Former   Smokeless tobacco: Never  Vaping Use   Vaping Use: Never used  Substance and Sexual Activity   Alcohol use: No   Drug use: No   Sexual activity: Not Currently  Other Topics Concern   Not on file  Social History Narrative   Single.   Works at [employer].    Enjoys watching Netflix.    Social Determinants of Health   Financial Resource Strain: Not on file  Food Insecurity: Not  on file  Transportation Needs: Not on file  Physical Activity: Not on file  Stress: Not on file  Social Connections: Not on file  Intimate Partner Violence: Not on file   Review of Systems No eye drainage or redness No recent travel    Objective:   Physical Exam Constitutional:      Appearance: Normal appearance.  HENT:     Right Ear: Ear canal normal.     Left Ear: Ear canal normal.     Ears:     Comments: Posterior scar on left TM Right TM is dull, inflamed and appears to have fluid    Mouth/Throat:     Pharynx: No oropharyngeal exudate or posterior oropharyngeal erythema.  Pulmonary:     Effort: Pulmonary effort is normal.     Breath sounds: Normal breath sounds. No wheezing or rales.  Musculoskeletal:     Cervical back: Neck supple.  Lymphadenopathy:     Cervical: No cervical adenopathy.  Neurological:     Mental Status: She is alert.             Assessment & Plan:

## 2022-08-12 ENCOUNTER — Encounter: Payer: Self-pay | Admitting: Primary Care

## 2022-08-12 ENCOUNTER — Ambulatory Visit (INDEPENDENT_AMBULATORY_CARE_PROVIDER_SITE_OTHER): Payer: Self-pay | Admitting: Primary Care

## 2022-08-12 VITALS — BP 98/60 | HR 88 | Temp 97.5°F | Ht 61.0 in | Wt 104.0 lb

## 2022-08-12 DIAGNOSIS — H938X1 Other specified disorders of right ear: Secondary | ICD-10-CM

## 2022-08-12 NOTE — Progress Notes (Signed)
Subjective:    Patient ID: Carol Spears, female    DOB: [birth year], 22 y.o.   MRN: [id number]  HPI  Nasiyah Welshans is a very pleasant 22 y.o. female with a history of right otitis media, anxiety, near syncope who presents today to discuss ear fullness.   Evaluated by Dr. Alphonsus Sias on 08/03/22 for a 2 day history of ear pain, 2 week history of allergy symptoms including nasal congestion, post nasal drip. She was treated with Amoxil 1000 mg BID x 7 days and Ibuprofen or naproxen PRN.  Since her last visit she completed her Amoxil. Her pain has improved and other symptoms have resolved, but she continues to notice ear fullness to the right ear.   She denies fevers, left ear fullness.    Review of Systems  Constitutional:  Negative for chills and fever.  HENT:  Negative for congestion, ear pain and sneezing.        Ear fullness  Respiratory:  Negative for cough.          Past Medical History:  Diagnosis Date   Acute knee pain 11/19/2018   Acute neck pain 11/01/2018   Anxiety    Back pain 11/08/2016   Breast mass 11/19/2018   History of kidney stones    Myopia 01/03/2012   Wears glasses   Otitis media, right 08/03/2022   Premature birth 11/24/2015   36 weeks Birth wt 4 lb 5 oz Hx of maternal depression Rx with prozac during pregnancy    Social History   Socioeconomic History   Marital status: Single    Spouse name: Not on file   Number of children: Not on file   Years of education: Not on file   Highest education level: Not on file  Occupational History   Not on file  Tobacco Use   Smoking status: Former   Smokeless tobacco: Never  Vaping Use   Vaping Use: Never used  Substance and Sexual Activity   Alcohol use: No   Drug use: No   Sexual activity: Not Currently  Other Topics Concern   Not on file  Social History Narrative   Single.   Works at [employer].    Enjoys watching Netflix.    Social Determinants of Health   Financial Resource  Strain: Not on file  Food Insecurity: Not on file  Transportation Needs: Not on file  Physical Activity: Not on file  Stress: Not on file  Social Connections: Not on file  Intimate Partner Violence: Not on file    Past Surgical History:  Procedure Laterality Date   LITHOTRIPSY     TYMPANOSTOMY TUBE PLACEMENT      Family History  Problem Relation Age of Onset   Depression Mother    Hypertension Mother    Hyperlipidemia Maternal Grandmother    Hypertension Maternal Grandmother    COPD Maternal Grandmother    Depression Maternal Grandmother    Hyperlipidemia Maternal Grandfather    Heart disease Maternal Grandfather    Diabetes Maternal Grandfather    COPD Maternal Grandfather    Hypertension Paternal Grandmother    Hyperlipidemia Paternal Grandfather    Heart disease Paternal Grandfather    Thyroid disease Maternal Aunt    Alcohol abuse Neg Hx    Arthritis Neg Hx    Asthma Neg Hx    Birth defects Neg Hx    Cancer Neg Hx    Drug abuse Neg Hx    Early death Neg Hx  Hearing loss Neg Hx    Kidney disease Neg Hx    Learning disabilities Neg Hx    Mental illness Neg Hx    Mental retardation Neg Hx    Miscarriages / Stillbirths Neg Hx    Stroke Neg Hx    Vision loss Neg Hx    Varicose Veins Neg Hx     Allergies  Allergen Reactions   Augmentin [Amoxicillin-Pot Clavulanate] Diarrhea    22 years old Has patient had a PCN reaction causing immediate rash, facial/tongue/throat swelling, SOB or lightheadedness with hypotension: No Has patient had a PCN reaction causing severe rash involving mucus membranes or skin necrosis: No Has patient had a PCN reaction that required hospitalization No Has patient had a PCN reaction occurring within the last 10 years: No If all of the above answers are "NO", then may proceed with Cephalosporin use.     Current Outpatient Medications on File Prior to Visit  Medication Sig Dispense Refill   amoxicillin (AMOXIL) 400 MG/5ML suspension  Take 12.5 mLs (1,000 mg total) by mouth 2 (two) times daily. 200 mL 1   No current facility-administered medications on file prior to visit.    BP 98/60   Pulse 88   Temp (!) 97.5 F (36.4 C) (Temporal)   Ht 5\' 1"  (1.549 m)   Wt 104 lb (47.2 kg)   SpO2 97%   BMI 19.65 kg/m  Objective:   Physical Exam Constitutional:      Appearance: She is not ill-appearing.  HENT:     Right Ear: There is no impacted cerumen. Tympanic membrane is bulging. Tympanic membrane is not erythematous.     Left Ear: Tympanic membrane and ear canal normal. There is no impacted cerumen. Tympanic membrane is not erythematous or bulging.     Ears:     Comments: Fluid noted behind right TM Cardiovascular:     Rate and Rhythm: Normal rate.  Pulmonary:     Effort: Pulmonary effort is normal.     Breath sounds: Normal breath sounds.           Assessment & Plan:  Ear fullness, right Assessment & Plan: Exam today consistent with effusion without infection.  Infection resolved. Discussed treatment. Will start with Flonase BID. Also discussed antihistamine.   Follow up PRN.         Doreene Nest, NP

## 2022-08-12 NOTE — Patient Instructions (Signed)
Try using Flonase (fluticasone) nasal spray. Instill 1 spray in each nostril twice daily.   You can also try an antihistamine such as Claritin, Allegra, or Zyrtec.  It was a pleasure to see you today!

## 2022-08-12 NOTE — Assessment & Plan Note (Signed)
Exam today consistent with effusion without infection.  Infection resolved. Discussed treatment. Will start with Flonase BID. Also discussed antihistamine.   Follow up PRN.

## 2024-04-17 ENCOUNTER — Ambulatory Visit: Admitting: Nurse Practitioner

## 2024-04-17 VITALS — BP 98/70 | HR 111 | Temp 98.4°F | Ht 61.0 in | Wt 89.6 lb

## 2024-04-17 DIAGNOSIS — R6883 Chills (without fever): Secondary | ICD-10-CM

## 2024-04-17 DIAGNOSIS — R52 Pain, unspecified: Secondary | ICD-10-CM | POA: Diagnosis not present

## 2024-04-17 DIAGNOSIS — H6993 Unspecified Eustachian tube disorder, bilateral: Secondary | ICD-10-CM

## 2024-04-17 DIAGNOSIS — J020 Streptococcal pharyngitis: Secondary | ICD-10-CM

## 2024-04-17 DIAGNOSIS — R051 Acute cough: Secondary | ICD-10-CM | POA: Diagnosis not present

## 2024-04-17 DIAGNOSIS — J029 Acute pharyngitis, unspecified: Secondary | ICD-10-CM | POA: Diagnosis not present

## 2024-04-17 LAB — POCT FLU A/B STATUS
Influenza A, POC: POSITIVE — AB
Influenza B, POC: NEGATIVE

## 2024-04-17 LAB — POC COVID19 BINAXNOW: SARS Coronavirus 2 Ag: NEGATIVE

## 2024-04-17 LAB — POCT RAPID STREP A (OFFICE): Rapid Strep A Screen: POSITIVE — AB

## 2024-04-17 MED ORDER — FLUTICASONE PROPIONATE 50 MCG/ACT NA SUSP: 2.0000 | Freq: Every day | NASAL | 0 refills | Status: AC

## 2024-04-17 MED ORDER — AMOXICILLIN 500 MG PO CAPS: 500.0000 mg | ORAL_CAPSULE | Freq: Two times a day (BID) | ORAL | 0 refills | Status: DC

## 2024-04-17 NOTE — Progress Notes (Signed)
 "  Established Patient Office Visit  Subjective   Patient ID: Carol Spears, female    DOB: 2000-12-02  Age: 24 y.o. MRN: [id number]  Chief Complaint  Patient presents with   Cough    Pt complains of runny nose, scratchy throat, cough, chills,     Discussed the use of AI scribe software for clinical note transcription with the patient, who gave verbal consent to proceed.  History of Present Illness Carol Spears is a 24 year old female who presents with symptoms of upper respiratory infection and back pain.  Symptoms began over the weekend with a scratchy throat, which was not particularly sore, followed by a runny nose the next day. Yesterday, symptoms intensified with continuous nasal dripping, though there is some improvement today. She has a cough with a small amount of phlegm but no shortness of breath. No headache.  Severe lower back and hip pain occurred yesterday, prompting her to seek medical attention, but this pain has subsided today. She experienced chills yesterday and the day before, but not today. She feels fatigued and lacking energy, describing her overall feeling as 'blah'.  Intermittent nausea is present, but there is no belly pain, vomiting, or diarrhea. She has not received her flu vaccine or any COVID vaccines this season. For her symptoms, she has only taken Tylenol  last night for back pain and has not used any other over-the-counter medications. No recent contact with anyone sick.  She is currently attending school online.     04/17/2024    3:18 PM 03/20/2013    5:20 PM  Depression screen PHQ 2/9  Decreased Interest 2 0  Down, Depressed, Hopeless 2 0  PHQ - 2 Score 4 0  Altered sleeping 1 0  Tired, decreased energy 2 0  Change in appetite 3 0  Feeling bad or failure about yourself  3 0  Trouble concentrating 3 0  Moving slowly or fidgety/restless 0 0  Suicidal thoughts 1 0   PHQ-9 Score 17 0   Difficult doing work/chores Somewhat difficult       Data saved with a previous flowsheet row definition      04/17/2024    3:18 PM  GAD 7 : Generalized Anxiety Score  Nervous, Anxious, on Edge 3  Control/stop worrying 2  Worry too much - different things 3  Trouble relaxing 2  Restless 0  Easily annoyed or irritable 2  Afraid - awful might happen 0  Total GAD 7 Score 12  Anxiety Difficulty Somewhat difficult        Review of Systems  Constitutional:  Positive for chills and malaise/fatigue. Negative for fever.  HENT:  Positive for sore throat.   Respiratory:  Positive for cough and sputum production. Negative for shortness of breath.   Gastrointestinal:  Positive for nausea. Negative for abdominal pain, diarrhea and vomiting.  Musculoskeletal:  Positive for myalgias.  Neurological:  Negative for headaches.      Objective:     BP 98/70   Pulse (!) 111   Temp 98.4 F (36.9 C) (Oral)   Ht 5' 1 (1.549 m)   Wt 89 lb 9.6 oz (40.6 kg)   SpO2 96%   BMI 16.93 kg/m    Physical Exam Vitals and nursing note reviewed.  Constitutional:      Appearance: Normal appearance.  HENT:     Right Ear: Tympanic membrane, ear canal and external ear normal.     Left Ear: Tympanic membrane, ear canal and external  ear normal.     Mouth/Throat:     Mouth: Mucous membranes are moist.     Pharynx: Oropharynx is clear.  Cardiovascular:     Rate and Rhythm: Regular rhythm. Tachycardia present.     Heart sounds: Normal heart sounds.  Pulmonary:     Effort: Pulmonary effort is normal.     Breath sounds: Normal breath sounds.  Lymphadenopathy:     Cervical: Cervical adenopathy present.  Neurological:     Mental Status: She is alert.      Results for orders placed or performed in visit on 04/17/24  POC COVID-19 BinaxNow  Result Value Ref Range   SARS Coronavirus 2 Ag Negative Negative  Rapid Strep A  Result Value Ref Range   Rapid Strep A Screen Positive (A) Negative  POCT Flu A & B Status  Result Value Ref Range    Influenza A, POC Positive (A) Negative   Influenza B, POC Negative Negative      The ASCVD Risk score (Arnett DK, et al., 2019) failed to calculate for the following reasons:   The 2019 ASCVD risk score is only valid for ages 86 to 28   * - Cholesterol units were assumed    Assessment & Plan:   Problem List Items Addressed This Visit   None Visit Diagnoses       Sore throat    -  Primary   Relevant Orders   POC COVID-19 BinaxNow (Completed)   Rapid Strep A (Completed)   POCT Flu A & B Status (Completed)     Acute cough       Relevant Orders   POC COVID-19 BinaxNow (Completed)   POCT Flu A & B Status (Completed)     Body aches       Relevant Orders   POC COVID-19 BinaxNow (Completed)   POCT Flu A & B Status (Completed)     Chills       Relevant Orders   POC COVID-19 BinaxNow (Completed)   POCT Flu A & B Status (Completed)     Dysfunction of both eustachian tubes       Relevant Medications   fluticasone  (FLONASE ) 50 MCG/ACT nasal spray     Strep pharyngitis       Relevant Medications   amoxicillin  (AMOXIL ) 500 MG capsule      Assessment and Plan Assessment & Plan Acute streptococcal pharyngitis Positive strep test with symptoms of scratchy throat and swollen lymph nodes. Fullness sensation likely due to Eustachian tube dysfunction. - Prescribed amoxicillin  500 mg twice daily for 10 days. - Advised to complete the full course of antibiotics even if symptoms improve. - Recommended Flonase  for fullness sensation. - Encouraged rest and increased fluid intake. - Advised use of Tylenol  or ibuprofen  for body aches as needed.  Acute influenza infection Faintly positive test with symptoms of runny nose, body aches, and fatigue. Improvement noted. - Encouraged rest and increased fluid intake. - Advised use of Tylenol  or ibuprofen  for body aches as needed.  Mood - Patient had elevated score on PHQ-9 and GAD-7.  Patient denies HI/SI/AVH.  Did discuss possibility of seeing  primary care to discuss nonpharmacological and pharmacological options for mood. Return if symptoms worsen or fail to improve.    Adina Crandall, NP  "

## 2024-04-17 NOTE — Patient Instructions (Signed)
 Nice to see you today You have the flu and strep throat I want you to rest, drink plenty of fluid, use tylenol /ibuprofen  as needed I have sent 2 medications to the pharmacy for you to start Follow up if you do not improve or as needed

## 2024-04-18 ENCOUNTER — Other Ambulatory Visit: Payer: Self-pay | Admitting: Nurse Practitioner

## 2024-04-18 ENCOUNTER — Telehealth: Payer: Self-pay

## 2024-04-18 DIAGNOSIS — J020 Streptococcal pharyngitis: Secondary | ICD-10-CM

## 2024-04-18 MED ORDER — AMOXICILLIN 250 MG/5ML PO SUSR: 500.0000 mg | Freq: Two times a day (BID) | ORAL | 0 refills | Status: AC

## 2024-04-18 NOTE — Telephone Encounter (Signed)
 Liquid version of amoxicillin  sent into pharmacy

## 2024-04-18 NOTE — Telephone Encounter (Signed)
 Copied from CRM [id number]. Topic: Clinical - Prescription Issue >> Apr 18, 2024  9:06 AM Carol Spears wrote: Reason for CRM: Patient called in prescription amoxicillin  (AMOXIL ) 500 MG capsule , is having trouble swallowing the capsule, wanted to know if the liquid could be sent over to the pharmacy  [facility] [address] [place] [facility] [address], [place] [address] Phone: [phone]  Fax: [phone]

## 2024-04-18 NOTE — Telephone Encounter (Signed)
 Copied from CRM #[id number]. Topic: Clinical - Prescription Issue >> Apr 18, 2024  1:16 PM Macario HERO wrote: Reason for CRM: Patient calling to check the status of her prescription. Stated she called earlier regarding not being able to take the pill form of amoxicillin  (AMOXIL ) 500 MG capsule [[id number]] and would like the liquid form. Requesting a follow up.

## 2024-04-18 NOTE — Addendum Note (Signed)
 Addended by: WENDEE LYNWOOD HERO on: 04/18/2024 04:18 PM   Modules accepted: Orders

## 2024-04-19 NOTE — Telephone Encounter (Signed)
 Called and informed pt of liquid amoxicillin  sent in. Pt verbalized understanding and has no questions or concerns.

## 2024-05-09 ENCOUNTER — Other Ambulatory Visit: Payer: Self-pay | Admitting: Nurse Practitioner

## 2024-05-09 DIAGNOSIS — H6993 Unspecified Eustachian tube disorder, bilateral: Secondary | ICD-10-CM
